# Patient Record
Sex: Female | Born: 1990 | Race: Black or African American | Hispanic: No | Marital: Single | Smoking: Former smoker
Health system: Southern US, Community
[De-identification: ages and names within clinical notes are randomized; demographics above are authoritative.]

## PROBLEM LIST (undated history)

## (undated) DIAGNOSIS — Z789 Other specified health status: Secondary | ICD-10-CM

## (undated) HISTORY — PX: MOUTH SURGERY: SHX715

## (undated) HISTORY — DX: Other specified health status: Z78.9

---

## 2011-12-20 DIAGNOSIS — IMO0002 Reserved for concepts with insufficient information to code with codable children: Secondary | ICD-10-CM

## 2015-01-27 NOTE — L&D Delivery Note (Signed)
Patient is 25 y.o. [id number] [redacted]w[redacted]d admitted for active, hx of History of prior pregnancy with IUGR newborn; Gestational thrombocytopenia ([place])    Delivery Note At 7:29 PM a viable female was delivered via Vaginal, Spontaneous Delivery (Presentation: LOA ).  APGAR: 8, 9; weight 8 lb 1.3 oz (3666 g).   Placenta status: intact, 3 vessel cord. Without complications  Anesthesia:  epidural Episiotomy: None Lacerations: None Est. Blood Loss (mL): 100  Mom to postpartum.  Baby to Couplet care / Skin to Skin.  Bufford Lope 09/14/2015, 9:11 PM      Upon arrival patient was complete and pushing. She pushed with good maternal effort to deliver a healthy baby boy. Baby delivered without difficulty, was noted to have good tone and place on maternal abdomen for oral suctioning, drying and stimulation. Delayed cord clamping performed. Placenta delivered intact with 3V cord. Vaginal canal and perineum was inspected and was intact. Pitocin was started and uterus massaged until bleeding slowed. Counts of sharps, instruments, and lap pads were all correct.   Bufford Lope, DO PGY-1, [facility] 8/19/20179:11 PM

## 2015-03-18 LAB — OB RESULTS CONSOLE RUBELLA ANTIBODY, IGM: Rubella: IMMUNE

## 2015-03-18 LAB — CYSTIC FIBROSIS DIAGNOSTIC STUDY: Interpretation-CFDNA:: NEGATIVE

## 2015-03-18 LAB — DRUG SCREEN, URINE

## 2015-03-18 LAB — OB RESULTS CONSOLE GC/CHLAMYDIA
CHLAMYDIA, DNA PROBE: NEGATIVE
GC PROBE AMP, GENITAL: NEGATIVE

## 2015-03-18 LAB — OB RESULTS CONSOLE HGB/HCT, BLOOD
HEMATOCRIT: 35 %
HEMOGLOBIN: 11.4 g/dL

## 2015-03-18 LAB — OB RESULTS CONSOLE RPR: RPR: NONREACTIVE

## 2015-03-18 LAB — OB RESULTS CONSOLE HEPATITIS B SURFACE ANTIGEN: Hepatitis B Surface Ag: NEGATIVE

## 2015-03-18 LAB — CULTURE, OB URINE
URINE CULTURE, OB: NEGATIVE
Urine Culture, OB: NEGATIVE

## 2015-03-18 LAB — OB RESULTS CONSOLE ANTIBODY SCREEN: ANTIBODY SCREEN: NEGATIVE

## 2015-03-18 LAB — OB RESULTS CONSOLE HIV ANTIBODY (ROUTINE TESTING): HIV: NONREACTIVE

## 2015-03-18 LAB — OB RESULTS CONSOLE PLATELET COUNT: PLATELETS: 164 10*3/uL

## 2015-03-18 LAB — SICKLE CELL SCREEN: Sickle Cell Screen: NORMAL

## 2015-03-18 LAB — OB RESULTS CONSOLE ABO/RH: RH TYPE: POSITIVE

## 2015-03-18 LAB — OB RESULTS CONSOLE VARICELLA ZOSTER ANTIBODY, IGG: Varicella: IMMUNE

## 2015-06-05 DIAGNOSIS — O0990 Supervision of high risk pregnancy, unspecified, unspecified trimester: Secondary | ICD-10-CM

## 2015-06-06 ENCOUNTER — Encounter: Payer: Self-pay | Admitting: Obstetrics & Gynecology

## 2015-07-08 ENCOUNTER — Encounter: Payer: Self-pay | Admitting: Obstetrics & Gynecology

## 2015-07-08 ENCOUNTER — Ambulatory Visit (INDEPENDENT_AMBULATORY_CARE_PROVIDER_SITE_OTHER): Payer: Medicaid Other | Admitting: Obstetrics & Gynecology

## 2015-07-08 VITALS — BP 116/74 | HR 78 | Wt 130.0 lb

## 2015-07-08 DIAGNOSIS — O99113 Other diseases of the blood and blood-forming organs and certain disorders involving the immune mechanism complicating pregnancy, third trimester: Secondary | ICD-10-CM | POA: Diagnosis present

## 2015-07-08 DIAGNOSIS — O0993 Supervision of high risk pregnancy, unspecified, third trimester: Secondary | ICD-10-CM

## 2015-07-08 DIAGNOSIS — Z23 Encounter for immunization: Secondary | ICD-10-CM

## 2015-07-08 DIAGNOSIS — Z8759 Personal history of other complications of pregnancy, childbirth and the puerperium: Secondary | ICD-10-CM | POA: Insufficient documentation

## 2015-07-08 DIAGNOSIS — Z8742 Personal history of other diseases of the female genital tract: Secondary | ICD-10-CM

## 2015-07-08 DIAGNOSIS — D696 Thrombocytopenia, unspecified: Secondary | ICD-10-CM

## 2015-07-08 LAB — POCT URINALYSIS DIP (DEVICE)
Bilirubin Urine: NEGATIVE
GLUCOSE, UA: NEGATIVE mg/dL
HGB URINE DIPSTICK: NEGATIVE
KETONES UR: NEGATIVE mg/dL
Nitrite: NEGATIVE
PROTEIN: NEGATIVE mg/dL
SPECIFIC GRAVITY, URINE: 1.02 (ref 1.005–1.030)
Urobilinogen, UA: 1 mg/dL (ref 0.0–1.0)
pH: 7 (ref 5.0–8.0)

## 2015-07-08 LAB — CBC
HCT: 31.6 % — ABNORMAL LOW (ref 35.0–45.0)
HEMOGLOBIN: 10.2 g/dL — AB (ref 11.7–15.5)
MCH: 30.3 pg (ref 27.0–33.0)
MCHC: 32.3 g/dL (ref 32.0–36.0)
MCV: 93.8 fL (ref 80.0–100.0)
MPV: 13.5 fL — ABNORMAL HIGH (ref 7.5–12.5)
PLATELETS: 128 10*3/uL — AB (ref 140–400)
RBC: 3.37 MIL/uL — AB (ref 3.80–5.10)
RDW: 13.5 % (ref 11.0–15.0)
WBC: 10 10*3/uL (ref 3.8–10.8)

## 2015-07-08 MED ORDER — TETANUS-DIPHTH-ACELL PERTUSSIS 5-2.5-18.5 LF-MCG/0.5 IM SUSP: 0.5000 mL | Freq: Once | INTRAMUSCULAR | Status: DC

## 2015-07-08 NOTE — Progress Notes (Signed)
Pt transferred from [facility].  28 week labs today.

## 2015-07-08 NOTE — Progress Notes (Signed)
Subjective:  Mallory Hampton is a 25 y.o. G2P0101 at [redacted]w[redacted]d being seen today for transfer of prenatal care from [facility] given history of IUGR and oligohydramnios in last pregnancy; she was induced for these indications and had a 3 lb 8 oz infant.  She is currently monitored for the following issues for this high-risk pregnancy and has Supervision of high-risk pregnancy and History of prior pregnancy with IUGR newborn on her problem list.  Patient reports no complaints.  Contractions: Not present. Vag. Bleeding: None.  Movement: Present. Denies leaking of fluid.   The following portions of the patient's history were reviewed and updated as appropriate: allergies, current medications, past family history, past medical history, past social history, past surgical history and problem list. Problem list updated.  Objective:   Filed Vitals:   07/08/15 0801  BP: 116/74  Pulse: 78  Weight: 130 lb (58.968 kg)    Fetal Status: Fetal Heart Rate (bpm): 143 Fundal Height: 32 cm Movement: Present     General:  Alert, oriented and cooperative. Patient is in no acute distress.  Skin: Skin is warm and dry. No rash noted.   Cardiovascular: Normal heart rate noted  Respiratory: Normal respiratory effort, no problems with respiration noted  Abdomen: Soft, gravid, appropriate for gestational age. Pain/Pressure: Absent     Pelvic: Cervical exam deferred        Extremities: Normal range of motion.  Edema: None  Mental Status: Normal mood and affect. Normal behavior. Normal judgment and thought content.   Urinalysis: Urine Protein: Negative Urine Glucose: Negative  Assessment and Plan:  Pregnancy: G2P0101 at [redacted]w[redacted]d  1. History of prior pregnancy with IUGR newborn Good fundal height for now, will continue to observe  2. Need for Tdap vaccination - Tdap (BOOSTRIX) injection 0.5 mL; Inject 0.5 mLs into the muscle once.  3. Supervision of high-risk pregnancy, third trimester Third trimester labs today - Glucose  Tolerance, 1 HR (50g) w/o Fasting - CBC - RPR - HIV antibody (with reflex) The nature of [place] with multiple MDs and other Advanced Practice Providers was explained to patient; also emphasized that residents, students are part of our team. Preterm labor symptoms and general obstetric precautions including but not limited to vaginal bleeding, contractions, leaking of fluid and fetal movement were reviewed in detail with the patient. Please refer to After Visit Summary for other counseling recommendations.  Return in about 2 weeks (around 07/22/2015) for OB Visit.   Osborne Oman, MD

## 2015-07-08 NOTE — Patient Instructions (Signed)
Return to clinic for any scheduled appointments or obstetric concerns, or go to [facility] for evaluation  Third Trimester of Pregnancy The third trimester is from week 29 through week 42, months 7 through 9. The third trimester is a time when the fetus is growing rapidly. At the end of the ninth month, the fetus is about 20 inches in length and weighs 6-10 pounds.  BODY CHANGES Your body goes through many changes during pregnancy. The changes vary from woman to woman.   Your weight will continue to increase. You can expect to gain 25-35 pounds (11-16 kg) by the end of the pregnancy.  You may begin to get stretch marks on your hips, abdomen, and breasts.  You may urinate more often because the fetus is moving lower into your pelvis and pressing on your bladder.  You may develop or continue to have heartburn as a result of your pregnancy.  You may develop constipation because certain hormones are causing the muscles that push waste through your intestines to slow down.  You may develop hemorrhoids or swollen, bulging veins (varicose veins).  You may have pelvic pain because of the weight gain and pregnancy hormones relaxing your joints between the bones in your pelvis. Backaches may result from overexertion of the muscles supporting your posture.  You may have changes in your hair. These can include thickening of your hair, rapid growth, and changes in texture. Some women also have hair loss during or after pregnancy, or hair that feels dry or thin. Your hair will most likely return to normal after your baby is born.  Your breasts will continue to grow and be tender. A yellow discharge may leak from your breasts called colostrum.  Your belly button may stick out.  You may feel short of breath because of your expanding uterus.  You may notice the fetus "dropping," or moving lower in your abdomen.  You may have a bloody mucus discharge. This usually occurs a few days to a week before labor  begins.  Your cervix becomes thin and soft (effaced) near your due date. WHAT TO EXPECT AT YOUR PRENATAL EXAMS  You will have prenatal exams every 2 weeks until week 36. Then, you will have weekly prenatal exams. During a routine prenatal visit:  You will be weighed to make sure you and the fetus are growing normally.  Your blood pressure is taken.  Your abdomen will be measured to track your baby's growth.  The fetal heartbeat will be listened to.  Any test results from the previous visit will be discussed.  You may have a cervical check near your due date to see if you have effaced. At around 36 weeks, your caregiver will check your cervix. At the same time, your caregiver will also perform a test on the secretions of the vaginal tissue. This test is to determine if a type of bacteria, Group B streptococcus, is present. Your caregiver will explain this further. Your caregiver may ask you:  What your birth plan is.  How you are feeling.  If you are feeling the baby move.  If you have had any abnormal symptoms, such as leaking fluid, bleeding, severe headaches, or abdominal cramping.  If you are using any tobacco products, including cigarettes, chewing tobacco, and electronic cigarettes.  If you have any questions. Other tests or screenings that may be performed during your third trimester include:  Blood tests that check for low iron levels (anemia).  Fetal testing to check the health, activity  level, and growth of the fetus. Testing is done if you have certain medical conditions or if there are problems during the pregnancy.  HIV (human immunodeficiency virus) testing. If you are at high risk, you may be screened for HIV during your third trimester of pregnancy. FALSE LABOR You may feel small, irregular contractions that eventually go away. These are called Braxton Hicks contractions, or false labor. Contractions may last for hours, days, or even weeks before true labor sets  in. If contractions come at regular intervals, intensify, or become painful, it is best to be seen by your caregiver.  SIGNS OF LABOR   Menstrual-like cramps.  Contractions that are 5 minutes apart or less.  Contractions that start on the top of the uterus and spread down to the lower abdomen and back.  A sense of increased pelvic pressure or back pain.  A watery or bloody mucus discharge that comes from the vagina. If you have any of these signs before the 37th week of pregnancy, call your caregiver right away. You need to go to the hospital to get checked immediately. HOME CARE INSTRUCTIONS   Avoid all smoking, herbs, alcohol, and unprescribed drugs. These chemicals affect the formation and growth of the baby.  Do not use any tobacco products, including cigarettes, chewing tobacco, and electronic cigarettes. If you need help quitting, ask your health care provider. You may receive counseling support and other resources to help you quit.  Follow your caregiver's instructions regarding medicine use. There are medicines that are either safe or unsafe to take during pregnancy.  Exercise only as directed by your caregiver. Experiencing uterine cramps is a good sign to stop exercising.  Continue to eat regular, healthy meals.  Wear a good support bra for breast tenderness.  Do not use hot tubs, steam rooms, or saunas.  Wear your seat belt at all times when driving.  Avoid raw meat, uncooked cheese, cat litter boxes, and soil used by cats. These carry germs that can cause birth defects in the baby.  Take your prenatal vitamins.  Take 1500-2000 mg of calcium daily starting at the 20th week of pregnancy until you deliver your baby.  Try taking a stool softener (if your caregiver approves) if you develop constipation. Eat more high-fiber foods, such as fresh vegetables or fruit and whole grains. Drink plenty of fluids to keep your urine clear or pale yellow.  Take warm sitz baths to  soothe any pain or discomfort caused by hemorrhoids. Use hemorrhoid cream if your caregiver approves.  If you develop varicose veins, wear support hose. Elevate your feet for 15 minutes, 3-4 times a day. Limit salt in your diet.  Avoid heavy lifting, wear low heal shoes, and practice good posture.  Rest a lot with your legs elevated if you have leg cramps or low back pain.  Visit your dentist if you have not gone during your pregnancy. Use a soft toothbrush to brush your teeth and be gentle when you floss.  A sexual relationship may be continued unless your caregiver directs you otherwise.  Do not travel far distances unless it is absolutely necessary and only with the approval of your caregiver.  Take prenatal classes to understand, practice, and ask questions about the labor and delivery.  Make a trial run to the hospital.  Pack your hospital bag.  Prepare the baby's nursery.  Continue to go to all your prenatal visits as directed by your caregiver. SEEK MEDICAL CARE IF:  You are unsure if  you are in labor or if your water has broken.  You have dizziness.  You have mild pelvic cramps, pelvic pressure, or nagging pain in your abdominal area.  You have persistent nausea, vomiting, or diarrhea.  You have a bad smelling vaginal discharge.  You have pain with urination. SEEK IMMEDIATE MEDICAL CARE IF:   You have a fever.  You are leaking fluid from your vagina.  You have spotting or bleeding from your vagina.  You have severe abdominal cramping or pain.  You have rapid weight loss or gain.  You have shortness of breath with chest pain.  You notice sudden or extreme swelling of your face, hands, ankles, feet, or legs.  You have not felt your baby move in over an hour.  You have severe headaches that do not go away with medicine.  You have vision changes.   This information is not intended to replace advice given to you by your health care provider. Make sure you  discuss any questions you have with your health care provider.   Document Released: 01/06/2001 Document Revised: 02/02/2014 Document Reviewed: 03/15/2012 Elsevier Interactive Patient Education [employer].

## 2015-07-09 LAB — RPR

## 2015-07-09 LAB — HIV ANTIBODY (ROUTINE TESTING W REFLEX): HIV 1&2 Ab, 4th Generation: NONREACTIVE

## 2015-07-09 LAB — GLUCOSE TOLERANCE, 1 HOUR (50G) W/O FASTING: GLUCOSE, 1 HR, GESTATIONAL: 94 mg/dL (ref <140)

## 2015-07-22 ENCOUNTER — Ambulatory Visit (INDEPENDENT_AMBULATORY_CARE_PROVIDER_SITE_OTHER): Payer: Medicaid Other | Admitting: Obstetrics and Gynecology

## 2015-07-22 VITALS — BP 114/65 | HR 76 | Wt 134.4 lb

## 2015-07-22 DIAGNOSIS — O99119 Other diseases of the blood and blood-forming organs and certain disorders involving the immune mechanism complicating pregnancy, unspecified trimester: Principal | ICD-10-CM

## 2015-07-22 DIAGNOSIS — O0990 Supervision of high risk pregnancy, unspecified, unspecified trimester: Secondary | ICD-10-CM

## 2015-07-22 DIAGNOSIS — D696 Thrombocytopenia, unspecified: Secondary | ICD-10-CM

## 2015-07-22 DIAGNOSIS — O99113 Other diseases of the blood and blood-forming organs and certain disorders involving the immune mechanism complicating pregnancy, third trimester: Secondary | ICD-10-CM | POA: Diagnosis not present

## 2015-07-22 DIAGNOSIS — Z23 Encounter for immunization: Secondary | ICD-10-CM

## 2015-07-22 LAB — POCT URINALYSIS DIP (DEVICE)
BILIRUBIN URINE: NEGATIVE
GLUCOSE, UA: NEGATIVE mg/dL
Hgb urine dipstick: NEGATIVE
KETONES UR: NEGATIVE mg/dL
Nitrite: NEGATIVE
PROTEIN: NEGATIVE mg/dL
SPECIFIC GRAVITY, URINE: 1.02 (ref 1.005–1.030)
Urobilinogen, UA: 0.2 mg/dL (ref 0.0–1.0)
pH: 7 (ref 5.0–8.0)

## 2015-07-22 NOTE — Progress Notes (Signed)
Subjective:  Mallory Hampton is a 25 y.o. G2P0101 at [redacted]w[redacted]d being seen today for ongoing prenatal care.  She is currently monitored for the following issues for this low-risk pregnancy and has Supervision of high-risk pregnancy and History of prior pregnancy with IUGR newborn on her problem list.  Patient reports no complaints.  Contractions: Not present.  .  Movement: Present. Denies leaking of fluid.   The following portions of the patient's history were reviewed and updated as appropriate: allergies, current medications, past family history, past medical history, past social history, past surgical history and problem list. Problem list updated.  Objective:   Filed Vitals:   07/22/15 0925  BP: 114/65  Pulse: 76  Weight: 134 lb 6.4 oz (60.963 kg)    Fetal Status: Fetal Heart Rate (bpm): 154   Movement: Present     General:  Alert, oriented and cooperative. Patient is in no acute distress.  Skin: Skin is warm and dry. No rash noted.   Cardiovascular: Normal heart rate noted  Respiratory: Normal respiratory effort, no problems with respiration noted  Abdomen: Soft, gravid, appropriate for gestational age. Pain/Pressure: Absent     Pelvic: Cervical exam deferred        Extremities: Normal range of motion.  Edema: None  Mental Status: Normal mood and affect. Normal behavior. Normal judgment and thought content.   Urinalysis:      Assessment and Plan:  Pregnancy: G2P0101 at [redacted]w[redacted]d  # Gestational thrombocytopenia - plts noted to be 128 2 wks ago, previously wnl - will monitor q 4-6 wks  # hx iugr - fundus measurement wnl, though has not increased much if any from 2 wks ago, will f/u at next visit  There are no diagnoses linked to this encounter. Preterm labor symptoms and general obstetric precautions including but not limited to vaginal bleeding, contractions, leaking of fluid and fetal movement were reviewed in detail with the patient. Please refer to After Visit Summary for other  counseling recommendations.  F/u 2 wks  Gwynne Edinger, MD

## 2015-07-22 NOTE — Addendum Note (Signed)
Addended by: Phillip Heal, DEMETRICE A on: 07/22/2015 09:57 AM   Modules accepted: Orders

## 2015-07-22 NOTE — Progress Notes (Signed)
Large leukocytes noted on urinalysis.  

## 2015-08-05 ENCOUNTER — Other Ambulatory Visit (HOSPITAL_COMMUNITY)
Admission: RE | Admit: 2015-08-05 | Discharge: 2015-08-05 | Disposition: A | Discharge status: Home / Self Care | Payer: Medicaid Other | Source: Ambulatory Visit | Attending: Obstetrics and Gynecology | Admitting: Obstetrics and Gynecology

## 2015-08-05 ENCOUNTER — Ambulatory Visit (INDEPENDENT_AMBULATORY_CARE_PROVIDER_SITE_OTHER): Payer: Medicaid Other | Admitting: Obstetrics and Gynecology

## 2015-08-05 VITALS — BP 111/69 | HR 66 | Wt 134.1 lb

## 2015-08-05 DIAGNOSIS — D696 Thrombocytopenia, unspecified: Secondary | ICD-10-CM

## 2015-08-05 DIAGNOSIS — Z8742 Personal history of other diseases of the female genital tract: Secondary | ICD-10-CM | POA: Diagnosis not present

## 2015-08-05 DIAGNOSIS — O99113 Other diseases of the blood and blood-forming organs and certain disorders involving the immune mechanism complicating pregnancy, third trimester: Secondary | ICD-10-CM

## 2015-08-05 DIAGNOSIS — Z113 Encounter for screening for infections with a predominantly sexual mode of transmission: Secondary | ICD-10-CM | POA: Diagnosis not present

## 2015-08-05 DIAGNOSIS — O0993 Supervision of high risk pregnancy, unspecified, third trimester: Secondary | ICD-10-CM

## 2015-08-05 DIAGNOSIS — D126 Benign neoplasm of colon, unspecified: Secondary | ICD-10-CM | POA: Diagnosis not present

## 2015-08-05 DIAGNOSIS — Z8759 Personal history of other complications of pregnancy, childbirth and the puerperium: Secondary | ICD-10-CM

## 2015-08-05 LAB — POCT URINALYSIS DIP (DEVICE)
Bilirubin Urine: NEGATIVE
GLUCOSE, UA: NEGATIVE mg/dL
HGB URINE DIPSTICK: NEGATIVE
Ketones, ur: NEGATIVE mg/dL
NITRITE: NEGATIVE
PROTEIN: 30 mg/dL — AB
SPECIFIC GRAVITY, URINE: 1.02 (ref 1.005–1.030)
UROBILINOGEN UA: 1 mg/dL (ref 0.0–1.0)
pH: 8.5 — ABNORMAL HIGH (ref 5.0–8.0)

## 2015-08-05 LAB — OB RESULTS CONSOLE GBS: GBS: POSITIVE

## 2015-08-05 LAB — CBC
HCT: 31.5 % — ABNORMAL LOW (ref 35.0–45.0)
Hemoglobin: 10.6 g/dL — ABNORMAL LOW (ref 11.7–15.5)
MCH: 31.1 pg (ref 27.0–33.0)
MCHC: 33.7 g/dL (ref 32.0–36.0)
MCV: 92.4 fL (ref 80.0–100.0)
MPV: 13.1 fL — ABNORMAL HIGH (ref 7.5–12.5)
PLATELETS: 121 10*3/uL — AB (ref 140–400)
RBC: 3.41 MIL/uL — ABNORMAL LOW (ref 3.80–5.10)
RDW: 13.5 % (ref 11.0–15.0)
WBC: 10.5 10*3/uL (ref 3.8–10.8)

## 2015-08-05 NOTE — Progress Notes (Signed)
Subjective:  Mallory Hampton is a 25 y.o. G2P0101 at [redacted]w[redacted]d being seen today for ongoing prenatal care.  She is currently monitored for the following issues for this low-risk pregnancy and has Supervision of high-risk pregnancy; History of prior pregnancy with IUGR newborn; and Gestational thrombocytopenia ([place]) on her problem list.  Patient reports no complaints.   Contractions: Not present.  .  Movement: Present. Denies leaking of fluid.   The following portions of the patient's history were reviewed and updated as appropriate: allergies, current medications, past family history, past medical history, past social history, past surgical history and problem list. Problem list updated.  Objective:   Filed Vitals:   08/05/15 1056  BP: 111/69  Pulse: 66  Weight: 134 lb 1.6 oz (60.827 kg)    Fetal Status: Fetal Heart Rate (bpm): 144   Movement: Present     General:  Alert, oriented and cooperative. Patient is in no acute distress.  Skin: Skin is warm and dry. No rash noted.   Cardiovascular: Normal heart rate noted  Respiratory: Normal respiratory effort, no problems with respiration noted  Abdomen: Soft, gravid, appropriate for gestational age. Pain/Pressure: Absent     Pelvic:  Cervical exam deferred        Extremities: Normal range of motion.  Edema: None  Mental Status: Normal mood and affect. Normal behavior. Normal judgment and thought content.   Urinalysis: Urine Protein: 1+ Urine Glucose: Negative  Assessment and Plan:  Pregnancy: G2P0101 at [redacted]w[redacted]d  1. Supervision of high risk pregnancy, antepartum, third trimester Routine care - Culture, beta strep (group b only) - GC/Chlamydia probe amp ([place])not at [facility]   2. H/o FGR, oligo Normal FH but given history, u/s for sometime in 7d ordered  3. Gest thrombocytopenia 72m surveillance cbc today CBC Latest Ref Rng 07/08/2015 03/18/2015  WBC 3.8 - 10.8 K/uL 10.0 -  Hemoglobin 11.7 - 15.5 g/dL 10.2(L) 11.4  Hematocrit 35.0 -  45.0 % 31.6(L) 35  Platelets 140 - 400 K/uL 128(L) 164    Preterm labor symptoms and general obstetric precautions including but not limited to vaginal bleeding, contractions, leaking of fluid and fetal movement were reviewed in detail with the patient. Please refer to After Visit Summary for other counseling recommendations.   Return in about 1 week (around 08/12/2015).   Aletha Halim, MD

## 2015-08-06 ENCOUNTER — Ambulatory Visit (HOSPITAL_COMMUNITY)
Admission: RE | Admit: 2015-08-06 | Discharge: 2015-08-06 | Disposition: A | Discharge status: Home / Self Care | Payer: Medicaid Other | Source: Ambulatory Visit | Attending: Obstetrics and Gynecology | Admitting: Obstetrics and Gynecology

## 2015-08-06 ENCOUNTER — Telehealth: Payer: Self-pay

## 2015-08-06 ENCOUNTER — Encounter: Payer: Self-pay | Admitting: Obstetrics and Gynecology

## 2015-08-06 ENCOUNTER — Encounter (HOSPITAL_COMMUNITY): Payer: Self-pay

## 2015-08-06 VITALS — BP 114/72 | HR 82 | Wt 135.2 lb

## 2015-08-06 DIAGNOSIS — O09293 Supervision of pregnancy with other poor reproductive or obstetric history, third trimester: Secondary | ICD-10-CM | POA: Insufficient documentation

## 2015-08-06 DIAGNOSIS — D696 Thrombocytopenia, unspecified: Secondary | ICD-10-CM | POA: Diagnosis not present

## 2015-08-06 DIAGNOSIS — O0993 Supervision of high risk pregnancy, unspecified, third trimester: Secondary | ICD-10-CM

## 2015-08-06 DIAGNOSIS — O99113 Other diseases of the blood and blood-forming organs and certain disorders involving the immune mechanism complicating pregnancy, third trimester: Principal | ICD-10-CM

## 2015-08-06 DIAGNOSIS — Z8759 Personal history of other complications of pregnancy, childbirth and the puerperium: Secondary | ICD-10-CM

## 2015-08-06 DIAGNOSIS — Z3A35 35 weeks gestation of pregnancy: Secondary | ICD-10-CM | POA: Insufficient documentation

## 2015-08-06 DIAGNOSIS — O99820 Streptococcus B carrier state complicating pregnancy: Secondary | ICD-10-CM | POA: Insufficient documentation

## 2015-08-06 DIAGNOSIS — Z36 Encounter for antenatal screening of mother: Secondary | ICD-10-CM | POA: Insufficient documentation

## 2015-08-06 LAB — CULTURE, BETA STREP (GROUP B ONLY)

## 2015-08-06 LAB — GC/CHLAMYDIA PROBE AMP (~~LOC~~) NOT AT ARMC
CHLAMYDIA, DNA PROBE: NEGATIVE
Neisseria Gonorrhea: NEGATIVE

## 2015-08-06 NOTE — Consult Note (Signed)
MFM consult  26 yr old G84P0101 at [redacted]w[redacted]d with history of baby with growth restriction and possible abruption and now with likely gestational thrombocytopenia referred by Dr. Ilda Basset for fetal ultrasound and consult.  Past OB hx: induction at 36w for fetal growth restriction and bleeding; SVD PMH: none PSH: none  Ultrasound today shows: single intrauterine pregnancy. Estimated fetal weight is in the 53rd%. Posterior placenta without evidence of previa. Normal amniotic fluid index. The views of the ductal and aortic arches and abdominal cord insertion are limited. The remainder of the limited anatomy survey is normal.  I counseled the patient as follows: 1. Appropriate fetal growth. 2. Normal limited anatomy survey: - patient understands the limitations of ultrasound in detecting fetal anomalies - limited by advanced gestational age 85. History of baby with growth restriction/ possible abruption: - recommend fetal growth every 4 weeks; appropriate fetal growth today - recommend in subsequent pregnancies starting a low dose aspirin by 19 weeks to reduce the risk of fetal growth restriction 4. Borderline thrombocytopenia: - discussed this is likely due to gestational thrombocytopenia given borderline platelets of 121,000 presenting late in pregnancy - discussed gestational thrombocytopenia is a benign, self-limited condition that requires no additional evaluation or treatment. Gestational thrombocytopenia accounts for the vast majority of all thrombocytopenias discovered during pregnancy, and almost all cases of thrombocytopenia in women with uncomplicated pregnancies.  Gestational thrombocytopenia is typically is characterized by the following:  ?Onset may be early in pregnancy, most common at delivery ?Mild thrombocytopenia (?80,000/microL, typically 100,000 to 150,000/microL) ?No increased bleeding or bruising ?No associated abnormalities on CBC ?No fetal or neonatal  thrombocytopenia Gestational thrombocytopenia is a diagnosis of exclusion. The diagnosis of gestational thrombocytoenpia is accepted if the woman has mild thrombocytopenia (platelet count 100,000 to 150,000/microL), especially during late pregnancy and at delivery, with no other associated findings on complete blood count (CBC) or physical examination. It usually resolves postpartum, but in some women the return to a normal platelet count requires more than six weeks.  Gestational thrombocytopenia requires no treatment and no change of normal prenatal care and management of delivery. No diagnostic testing is necessary because a platelet count >100,000/microL causes no risk for the mother.  - recommend repeat platelet level in 3 weeks - discussed if platelets drop to <100,000 regional anesthesia may not be an option - if platelets drop <100,000 recommend consult with Anesthesia- if epidural desired and not able to be done based on platelet counts can consider a course of prednisone prior to delivery to increase platelet levels  I spent a total of 25 minutes with the patient of which >50% was in face to face consultation.  [place], MD

## 2015-08-12 ENCOUNTER — Ambulatory Visit (INDEPENDENT_AMBULATORY_CARE_PROVIDER_SITE_OTHER): Payer: Medicaid Other | Admitting: Family Medicine

## 2015-08-12 VITALS — BP 105/72 | HR 66 | Wt 136.0 lb

## 2015-08-12 DIAGNOSIS — O0993 Supervision of high risk pregnancy, unspecified, third trimester: Secondary | ICD-10-CM | POA: Diagnosis not present

## 2015-08-12 LAB — POCT URINALYSIS DIP (DEVICE)
BILIRUBIN URINE: NEGATIVE
GLUCOSE, UA: NEGATIVE mg/dL
Hgb urine dipstick: NEGATIVE
Ketones, ur: NEGATIVE mg/dL
NITRITE: NEGATIVE
Protein, ur: NEGATIVE mg/dL
Specific Gravity, Urine: 1.02 (ref 1.005–1.030)
UROBILINOGEN UA: 1 mg/dL (ref 0.0–1.0)
pH: 7 (ref 5.0–8.0)

## 2015-08-12 NOTE — Progress Notes (Signed)
Breastfeeding discussed with patient

## 2015-08-12 NOTE — Progress Notes (Signed)
Subjective:  Mallory Hampton is a 25 y.o. G2P0101 at [redacted]w[redacted]d being seen today for ongoing prenatal care.  She is currently monitored for the following issues for this high-risk pregnancy and has Supervision of high-risk pregnancy; History of prior pregnancy with IUGR newborn; Gestational thrombocytopenia ([place]); and GBS (group B Streptococcus carrier), +RV culture, currently pregnant on her problem list.  Patient reports no complaints.  Contractions: Not present. Vag. Bleeding: None.  Movement: Present. Denies leaking of fluid.   The following portions of the patient's history were reviewed and updated as appropriate: allergies, current medications, past family history, past medical history, past social history, past surgical history and problem list. Problem list updated.  Objective:   Filed Vitals:   08/12/15 1105  BP: 105/72  Pulse: 66  Weight: 136 lb (61.689 kg)    Fetal Status: Fetal Heart Rate (bpm): 142 lb   Movement: Present     General:  Alert, oriented and cooperative. Patient is in no acute distress.  Skin: Skin is warm and dry. No rash noted.   Cardiovascular: Normal heart rate noted  Respiratory: Normal respiratory effort, no problems with respiration noted  Abdomen: Soft, gravid, appropriate for gestational age. Pain/Pressure: Absent     Pelvic:  Cervical exam deferred        Extremities: Normal range of motion.  Edema: None  Mental Status: Normal mood and affect. Normal behavior. Normal judgment and thought content.   Urinalysis: Urine Protein: Negative Urine Glucose: Negative  Assessment and Plan:  Pregnancy: G2P0101 at [redacted]w[redacted]d  1. Supervision of high-risk pregnancy, third trimester Continue current care. History of IUGR. Pt w/ last US showing EFW 53%. Repeat already scheduled.  Check platelets prior to delivery for history of thrombocytopenia.   Preterm labor symptoms and general obstetric precautions including but not limited to vaginal bleeding, contractions, leaking  of fluid and fetal movement were reviewed in detail with the patient. Please refer to After Visit Summary for other counseling recommendations.  Return in about 1 week (around 08/19/2015) for HROB.   Waldemar Dickens, MD

## 2015-08-12 NOTE — Patient Instructions (Signed)
Group B streptococcus (GBS) is a type of bacteria often found in healthy women. GBS is not the same as the bacteria that causes strep throat. You may have GBS in your vagina, rectum, or bladder. GBS does not spread through sexual contact, but it can be passed to a baby during childbirth. This can be dangerous for your baby. It is not dangerous to you and usually does not cause any symptoms. Your health care provider may test you for GBS when your pregnancy is between 35 and 37 weeks. GBS is dangerous only during birth, so there is no need to test for it earlier. It is possible to have GBS during pregnancy and never pass it to your baby. If your test results are positive for GBS, your health care provider may recommend giving you antibiotic medicine during delivery to make sure your baby stays healthy. RISK FACTORS You are more likely to pass GBS to your baby if:   Your water breaks (ruptured membrane) or you go into labor before 37 weeks.  Your water breaks 18 hours before you deliver.  You passed GBS during a previous pregnancy.  You have a urinary tract infection caused by GBS any time during pregnancy.  You have a fever during labor. SYMPTOMS Most women who have GBS do not have any symptoms. If you have a urinary tract infection caused by GBS, you might have frequent or painful urination and fever. Babies who get GBS usually show symptoms within 7 days of birth. Symptoms may include:   Breathing problems.  Heart and blood pressure problems.  Digestive and kidney problems. DIAGNOSIS Routine screening for GBS is recommended for all pregnant women. A health care provider takes a sample of the fluid in your vagina and rectum with a swab. It is then sent to a lab to be checked for GBS. A sample of your urine may also be checked for the bacteria.  TREATMENT If you test positive for GBS, you may need treatment with an antibiotic medicine during labor. As soon as you go into labor, or as soon as  your membranes rupture, you will get the antibiotic medicine through an IV access. You will continue to get the medicine until after you give birth. You do not need antibiotic medicine if you are having a cesarean delivery.If your baby shows signs or symptoms of GBS after birth, your baby can also be treated with an antibiotic medicine. HOME CARE INSTRUCTIONS   Take all antibiotic medicine as prescribed by your health care provider. Only take medicine as directed.   Continue with prenatal visits and care.   Keep all follow-up appointments.  SEEK MEDICAL CARE IF:   You have pain when you urinate.   You have to urinate frequently.   You have a fever.  SEEK IMMEDIATE MEDICAL CARE IF:   Your membranes rupture.  You go into labor.   This information is not intended to replace advice given to you by your health care provider. Make sure you discuss any questions you have with your health care provider.   Document Released: 04/21/2007 Document Revised: 01/17/2013 Document Reviewed: 11/04/2012 Elsevier Interactive Patient Education [employer]. Breastfeeding Deciding to breastfeed is one of the best choices you can make for you and your baby. A change in hormones during pregnancy causes your breast tissue to grow and increases the number and size of your milk ducts. These hormones also allow proteins, sugars, and fats from your blood supply to make breast milk in your  milk-producing glands. Hormones prevent breast milk from being released before your baby is born as well as prompt milk flow after birth. Once breastfeeding has begun, thoughts of your baby, as well as his or her sucking or crying, can stimulate the release of milk from your milk-producing glands.  BENEFITS OF BREASTFEEDING For Your Baby  Your first milk (colostrum) helps your baby's digestive system function better.  There are antibodies in your milk that help your baby fight off infections.  Your baby has a  lower incidence of asthma, allergies, and sudden infant death syndrome.  The nutrients in breast milk are better for your baby than infant formulas and are designed uniquely for your baby's needs.  Breast milk improves your baby's brain development.  Your baby is less likely to develop other conditions, such as childhood obesity, asthma, or type 2 diabetes mellitus. For You  Breastfeeding helps to create a very special bond between you and your baby.  Breastfeeding is convenient. Breast milk is always available at the correct temperature and costs nothing.  Breastfeeding helps to burn calories and helps you lose the weight gained during pregnancy.  Breastfeeding makes your uterus contract to its prepregnancy size faster and slows bleeding (lochia) after you give birth.   Breastfeeding helps to lower your risk of developing type 2 diabetes mellitus, osteoporosis, and breast or ovarian cancer later in life. SIGNS THAT YOUR BABY IS HUNGRY Early Signs of Hunger  Increased alertness or activity.  Stretching.  Movement of the head from side to side.  Movement of the head and opening of the mouth when the corner of the mouth or cheek is stroked (rooting).  Increased sucking sounds, smacking lips, cooing, sighing, or squeaking.  Hand-to-mouth movements.  Increased sucking of fingers or hands. Late Signs of Hunger  Fussing.  Intermittent crying. Extreme Signs of Hunger Signs of extreme hunger will require calming and consoling before your baby will be able to breastfeed successfully. Do not wait for the following signs of extreme hunger to occur before you initiate breastfeeding:  Restlessness.  A loud, strong cry.  Screaming. BREASTFEEDING BASICS Breastfeeding Initiation  Find a comfortable place to sit or lie down, with your neck and back well supported.  Place a pillow or rolled up blanket under your baby to bring him or her to the level of your breast (if you are  seated). Nursing pillows are specially designed to help support your arms and your baby while you breastfeed.  Make sure that your baby's abdomen is facing your abdomen.  Gently massage your breast. With your fingertips, massage from your chest wall toward your nipple in a circular motion. This encourages milk flow. You may need to continue this action during the feeding if your milk flows slowly.  Support your breast with 4 fingers underneath and your thumb above your nipple. Make sure your fingers are well away from your nipple and your baby's mouth.  Stroke your baby's lips gently with your finger or nipple.  When your baby's mouth is open wide enough, quickly bring your baby to your breast, placing your entire nipple and as much of the colored area around your nipple (areola) as possible into your baby's mouth.  More areola should be visible above your baby's upper lip than below the lower lip.  Your baby's tongue should be between his or her lower gum and your breast.  Ensure that your baby's mouth is correctly positioned around your nipple (latched). Your baby's lips should create a  seal on your breast and be turned out (everted).  It is common for your baby to suck about 2-3 minutes in order to start the flow of breast milk. Latching Teaching your baby how to latch on to your breast properly is very important. An improper latch can cause nipple pain and decreased milk supply for you and poor weight gain in your baby. Also, if your baby is not latched onto your nipple properly, he or she may swallow some air during feeding. This can make your baby fussy. Burping your baby when you switch breasts during the feeding can help to get rid of the air. However, teaching your baby to latch on properly is still the best way to prevent fussiness from swallowing air while breastfeeding. Signs that your baby has successfully latched on to your nipple:  Silent tugging or silent sucking, without  causing you pain.  Swallowing heard between every 3-4 sucks.  Muscle movement above and in front of his or her ears while sucking. Signs that your baby has not successfully latched on to nipple:  Sucking sounds or smacking sounds from your baby while breastfeeding.  Nipple pain. If you think your baby has not latched on correctly, slip your finger into the corner of your baby's mouth to break the suction and place it between your baby's gums. Attempt breastfeeding initiation again. Signs of Successful Breastfeeding Signs from your baby:  A gradual decrease in the number of sucks or complete cessation of sucking.  Falling asleep.  Relaxation of his or her body.  Retention of a small amount of milk in his or her mouth.  Letting go of your breast by himself or herself. Signs from you:  Breasts that have increased in firmness, weight, and size 1-3 hours after feeding.  Breasts that are softer immediately after breastfeeding.  Increased milk volume, as well as a change in milk consistency and color by the fifth day of breastfeeding.  Nipples that are not sore, cracked, or bleeding. Signs That Your [employer] is Getting Enough Milk  Wetting at least 3 diapers in a 24-hour period. The urine should be clear and pale yellow by age 67 days.  At least 3 stools in a 24-hour period by age 67 days. The stool should be soft and yellow.  At least 3 stools in a 24-hour period by age 520 days. The stool should be seedy and yellow.  No loss of weight greater than 10% of birth weight during the first 76 days of age.  Average weight gain of 4-7 ounces (113-198 g) per week after age 52 days.  Consistent daily weight gain by age 62 days, without weight loss after the age of 2 weeks. After a feeding, your baby may spit up a small amount. This is common. BREASTFEEDING FREQUENCY AND DURATION Frequent feeding will help you make more milk and can prevent sore nipples and breast engorgement. Breastfeed when you  feel the need to reduce the fullness of your breasts or when your baby shows signs of hunger. This is called "breastfeeding on demand." Avoid introducing a pacifier to your baby while you are working to establish breastfeeding (the first 4-6 weeks after your baby is born). After this time you may choose to use a pacifier. Research has shown that pacifier use during the first year of a baby's life decreases the risk of sudden infant death syndrome (SIDS). Allow your baby to feed on each breast as long as he or she wants. Breastfeed until your baby  is finished feeding. When your baby unlatches or falls asleep while feeding from the first breast, offer the second breast. Because newborns are often sleepy in the first few weeks of life, you may need to awaken your baby to get him or her to feed. Breastfeeding times will vary from baby to baby. However, the following rules can serve as a guide to help you ensure that your baby is properly fed:  Newborns (babies 88 weeks of age or younger) may breastfeed every 1-3 hours.  Newborns should not go longer than 3 hours during the day or 5 hours during the night without breastfeeding.  You should breastfeed your baby a minimum of 8 times in a 24-hour period until you begin to introduce solid foods to your baby at around 18 months of age. BREAST MILK PUMPING Pumping and storing breast milk allows you to ensure that your baby is exclusively fed your breast milk, even at times when you are unable to breastfeed. This is especially important if you are going back to work while you are still breastfeeding or when you are not able to be present during feedings. Your lactation consultant can give you guidelines on how long it is safe to store breast milk. A breast pump is a machine that allows you to pump milk from your breast into a sterile bottle. The pumped breast milk can then be stored in a refrigerator or freezer. Some breast pumps are operated by hand, while others use  electricity. Ask your lactation consultant which type will work best for you. Breast pumps can be purchased, but some hospitals and breastfeeding support groups lease breast pumps on a monthly basis. A lactation consultant can teach you how to hand express breast milk, if you prefer not to use a pump. CARING FOR YOUR BREASTS WHILE YOU BREASTFEED Nipples can become dry, cracked, and sore while breastfeeding. The following recommendations can help keep your breasts moisturized and healthy:  Avoid using soap on your nipples.  Wear a supportive bra. Although not required, special nursing bras and tank tops are designed to allow access to your breasts for breastfeeding without taking off your entire bra or top. Avoid wearing underwire-style bras or extremely tight bras.  Air dry your nipples for 3-6minutes after each feeding.  Use only cotton bra pads to absorb leaked breast milk. Leaking of breast milk between feedings is normal.  Use lanolin on your nipples after breastfeeding. Lanolin helps to maintain your skin's normal moisture barrier. If you use pure lanolin, you do not need to wash it off before feeding your baby again. Pure lanolin is not toxic to your baby. You may also hand express a few drops of breast milk and gently massage that milk into your nipples and allow the milk to air dry. In the first few weeks after giving birth, some women experience extremely full breasts (engorgement). Engorgement can make your breasts feel heavy, warm, and tender to the touch. Engorgement peaks within 3-5 days after you give birth. The following recommendations can help ease engorgement:  Completely empty your breasts while breastfeeding or pumping. You may want to start by applying warm, moist heat (in the shower or with warm water-soaked hand towels) just before feeding or pumping. This increases circulation and helps the milk flow. If your baby does not completely empty your breasts while breastfeeding,  pump any extra milk after he or she is finished.  Wear a snug bra (nursing or regular) or tank top for 1-2 days to  signal your body to slightly decrease milk production.  Apply ice packs to your breasts, unless this is too uncomfortable for you.  Make sure that your baby is latched on and positioned properly while breastfeeding. If engorgement persists after 48 hours of following these recommendations, contact your health care provider or a Science writer. OVERALL HEALTH CARE RECOMMENDATIONS WHILE BREASTFEEDING  Eat healthy foods. Alternate between meals and snacks, eating 3 of each per day. Because what you eat affects your breast milk, some of the foods may make your baby more irritable than usual. Avoid eating these foods if you are sure that they are negatively affecting your baby.  Drink milk, fruit juice, and water to satisfy your thirst (about 10 glasses a day).  Rest often, relax, and continue to take your prenatal vitamins to prevent fatigue, stress, and anemia.  Continue breast self-awareness checks.  Avoid chewing and smoking tobacco. Chemicals from cigarettes that pass into breast milk and exposure to secondhand smoke may harm your baby.  Avoid alcohol and drug use, including marijuana. Some medicines that may be harmful to your baby can pass through breast milk. It is important to ask your health care provider before taking any medicine, including all over-the-counter and prescription medicine as well as vitamin and herbal supplements. It is possible to become pregnant while breastfeeding. If birth control is desired, ask your health care provider about options that will be safe for your baby. SEEK MEDICAL CARE IF:  You feel like you want to stop breastfeeding or have become frustrated with breastfeeding.  You have painful breasts or nipples.  Your nipples are cracked or bleeding.  Your breasts are red, tender, or warm.  You have a swollen area on either  breast.  You have a fever or chills.  You have nausea or vomiting.  You have drainage other than breast milk from your nipples.  Your breasts do not become full before feedings by the fifth day after you give birth.  You feel sad and depressed.  Your baby is too sleepy to eat well.  Your baby is having trouble sleeping.   Your baby is wetting less than 3 diapers in a 24-hour period.  Your baby has less than 3 stools in a 24-hour period.  Your baby's skin or the white part of his or her eyes becomes yellow.   Your baby is not gaining weight by 69 days of age. SEEK IMMEDIATE MEDICAL CARE IF:  Your baby is overly tired (lethargic) and does not want to wake up and feed.  Your baby develops an unexplained fever.   This information is not intended to replace advice given to you by your health care provider. Make sure you discuss any questions you have with your health care provider.   Document Released: 01/12/2005 Document Revised: 10/03/2014 Document Reviewed: 07/06/2012 Elsevier Interactive Patient Education [employer].

## 2015-08-19 ENCOUNTER — Encounter: Payer: Medicaid Other | Admitting: Obstetrics & Gynecology

## 2015-09-02 ENCOUNTER — Ambulatory Visit (HOSPITAL_COMMUNITY)
Admission: RE | Admit: 2015-09-02 | Discharge: 2015-09-02 | Disposition: A | Discharge status: Home / Self Care | Payer: Medicaid Other | Source: Ambulatory Visit | Attending: Obstetrics and Gynecology | Admitting: Obstetrics and Gynecology

## 2015-09-02 ENCOUNTER — Encounter (HOSPITAL_COMMUNITY): Payer: Self-pay

## 2015-09-02 ENCOUNTER — Other Ambulatory Visit (HOSPITAL_COMMUNITY): Payer: Self-pay | Admitting: Maternal and Fetal Medicine

## 2015-09-02 DIAGNOSIS — Z8759 Personal history of other complications of pregnancy, childbirth and the puerperium: Secondary | ICD-10-CM

## 2015-09-02 DIAGNOSIS — Z36 Encounter for antenatal screening of mother: Secondary | ICD-10-CM | POA: Insufficient documentation

## 2015-09-02 DIAGNOSIS — Z3A39 39 weeks gestation of pregnancy: Secondary | ICD-10-CM

## 2015-09-02 DIAGNOSIS — O99113 Other diseases of the blood and blood-forming organs and certain disorders involving the immune mechanism complicating pregnancy, third trimester: Secondary | ICD-10-CM | POA: Insufficient documentation

## 2015-09-02 DIAGNOSIS — O09293 Supervision of pregnancy with other poor reproductive or obstetric history, third trimester: Secondary | ICD-10-CM | POA: Insufficient documentation

## 2015-09-05 ENCOUNTER — Encounter: Payer: Self-pay | Admitting: Obstetrics & Gynecology

## 2015-09-05 ENCOUNTER — Ambulatory Visit (INDEPENDENT_AMBULATORY_CARE_PROVIDER_SITE_OTHER): Payer: Medicaid Other | Admitting: Obstetrics & Gynecology

## 2015-09-05 VITALS — BP 119/64 | HR 73 | Wt 144.0 lb

## 2015-09-05 DIAGNOSIS — O0993 Supervision of high risk pregnancy, unspecified, third trimester: Secondary | ICD-10-CM

## 2015-09-05 DIAGNOSIS — D696 Thrombocytopenia, unspecified: Secondary | ICD-10-CM | POA: Diagnosis not present

## 2015-09-05 DIAGNOSIS — O99113 Other diseases of the blood and blood-forming organs and certain disorders involving the immune mechanism complicating pregnancy, third trimester: Secondary | ICD-10-CM | POA: Diagnosis present

## 2015-09-05 LAB — CBC
HEMATOCRIT: 31.1 % — AB (ref 35.0–45.0)
Hemoglobin: 10.2 g/dL — ABNORMAL LOW (ref 11.7–15.5)
MCH: 30.2 pg (ref 27.0–33.0)
MCHC: 32.8 g/dL (ref 32.0–36.0)
MCV: 92 fL (ref 80.0–100.0)
MPV: 13.9 fL — ABNORMAL HIGH (ref 7.5–12.5)
PLATELETS: 110 10*3/uL — AB (ref 140–400)
RBC: 3.38 MIL/uL — ABNORMAL LOW (ref 3.80–5.10)
RDW: 13.5 % (ref 11.0–15.0)
WBC: 10 10*3/uL (ref 3.8–10.8)

## 2015-09-05 LAB — POCT URINALYSIS DIP (DEVICE)
Bilirubin Urine: NEGATIVE
Glucose, UA: NEGATIVE mg/dL
Hgb urine dipstick: NEGATIVE
KETONES UR: NEGATIVE mg/dL
Nitrite: NEGATIVE
PH: 7 (ref 5.0–8.0)
PROTEIN: 30 mg/dL — AB
SPECIFIC GRAVITY, URINE: 1.025 (ref 1.005–1.030)
UROBILINOGEN UA: 2 mg/dL — AB (ref 0.0–1.0)

## 2015-09-05 NOTE — Patient Instructions (Signed)
Vaginal Delivery °During delivery, your health care provider will help you give birth to your baby. During a vaginal delivery, you will work to push the baby out of your vagina. However, before you can push your baby out, a few things need to happen. The opening of your uterus (cervix) has to soften, thin out, and open up (dilate) all the way to 10 cm. Also, your baby has to move down from the uterus into your vagina.  °SIGNS OF LABOR  °Your health care provider will first need to make sure you are in labor. Signs of labor include:  °· Passing what is called the mucous plug before labor begins. This is a small amount of blood-stained mucus. °· Having regular, painful uterine contractions.   °· The time between contractions gets shorter.   °· The discomfort and pain gradually get more intense. °· Contraction pains get worse when walking and do not go away when resting.   °· Your cervix becomes thinner (effacement) and dilates. °BEFORE THE DELIVERY °Once you are in labor and admitted into the hospital or care center, your health care provider may do the following:  °· Perform a complete physical exam. °· Review any complications related to pregnancy or labor.  °· Check your blood pressure, pulse, temperature, and heart rate (vital signs).   °· Determine if, and when, the rupture of amniotic membranes occurred. °· Do a vaginal exam (using a sterile glove and lubricant) to determine:   °¨ The position (presentation) of the baby. Is the baby's head presenting first (vertex) in the birth canal (vagina), or are the feet or buttocks first (breech)?   °¨ The level (station) of the baby's head within the birth canal.   °¨ The effacement and dilatation of the cervix.   °· An electronic fetal monitor is usually placed on your abdomen when you first arrive. This is used to monitor your contractions and the baby's heart rate. °¨ When the monitor is on your abdomen (external fetal monitor), it can only pick up the frequency and  length of your contractions. It cannot tell the strength of your contractions. °¨ If it becomes necessary for your health care provider to know exactly how strong your contractions are or to see exactly what the baby's heart rate is doing, an internal monitor may be inserted into your vagina and uterus. Your health care provider will discuss the benefits and risks of using an internal monitor and obtain your permission before inserting the device. °¨ Continuous fetal monitoring may be needed if you have an epidural, are receiving certain medicines (such as oxytocin), or have pregnancy or labor complications. °· An IV access tube may be placed into a vein in your arm to deliver fluids and medicines if necessary. °THREE STAGES OF LABOR AND DELIVERY °Normal labor and delivery is divided into three stages. °First Stage °This stage starts when you begin to contract regularly and your cervix begins to efface and dilate. It ends when your cervix is completely open (fully dilated). The first stage is the longest stage of labor and can last from 3 hours to 15 hours.  °Several methods are available to help with labor pain. You and your health care provider will decide which option is best for you. Options include:  °· Opioid medicines. These are strong pain medicines that you can get through your IV tube or as a shot into your muscle. These medicines lessen pain but do not make it go away completely.  °· Epidural. A medicine is given through a thin tube that   is inserted in your back. The medicine numbs the lower part of your body and prevents any pain in that area. °· Paracervical pain medicine. This is an injection of an anesthetic on each side of your cervix.   °· You may request natural childbirth, which does not involve the use of pain medicines or an epidural during labor and delivery. Instead, you will use other things, such as breathing exercises, to help cope with the pain. °Second Stage °The second stage of labor  begins when your cervix is fully dilated at 10 cm. It continues until you push your baby down through the birth canal and the baby is born. This stage can take only minutes or several hours. °· The location of your baby's head as it moves through the birth canal is reported as a number called a station. If the baby's head has not started its descent, the station is described as being at minus 3 (-3). When your baby's head is at the zero station, it is at the middle of the birth canal and is engaged in the pelvis. The station of your baby helps indicate the progress of the second stage of labor. °· When your baby is born, your health care provider may hold the baby with his or her head lowered to prevent amniotic fluid, mucus, and blood from getting into the baby's lungs. The baby's mouth and nose may be suctioned with a small bulb syringe to remove any additional fluid. °· Your health care provider may then place the baby on your stomach. It is important to keep the baby from getting cold. To do this, the health care provider will dry the baby off, place the baby directly on your skin (with no blankets between you and the baby), and cover the baby with warm, dry blankets.   °· The umbilical cord is cut. °Third Stage °During the third stage of labor, your health care provider will deliver the placenta (afterbirth) and make sure your bleeding is under control. The delivery of the placenta usually takes about 5 minutes but can take up to 30 minutes. After the placenta is delivered, a medicine may be given either by IV or injection to help contract the uterus and control bleeding. If you are planning to breastfeed, you can try to do so now. °After you deliver the placenta, your uterus should contract and get very firm. If your uterus does not remain firm, your health care provider will massage it. This is important because the contraction of the uterus helps cut off bleeding at the site where the placenta was attached  to your uterus. If your uterus does not contract properly and stay firm, you may continue to bleed heavily. If there is a lot of bleeding, medicines may be given to contract the uterus and stop the bleeding.  °  °This information is not intended to replace advice given to you by your health care provider. Make sure you discuss any questions you have with your health care provider. °  °Document Released: 10/22/2007 Document Revised: 02/02/2014 Document Reviewed: 09/09/2011 °Elsevier Interactive Patient Education ©2016 [employer] ° °

## 2015-09-05 NOTE — Progress Notes (Signed)
Subjective:  Mallory Hampton is a 25 y.o. G2P0101 at [redacted]w[redacted]d being seen today for ongoing prenatal care.  She is currently monitored for the following issues for this high-risk pregnancy and has Supervision of high-risk pregnancy; History of prior pregnancy with IUGR newborn; Gestational thrombocytopenia ([place]); and GBS (group B Streptococcus carrier), +RV culture, currently pregnant on her problem list.  Patient reports no complaints.  Contractions: Not present. Vag. Bleeding: None.  Movement: Present. Denies leaking of fluid.   The following portions of the patient's history were reviewed and updated as appropriate: allergies, current medications, past family history, past medical history, past social history, past surgical history and problem list. Problem list updated.  Objective:   Vitals:   09/05/15 0903  BP: 119/64  Pulse: 73  Weight: 144 lb (65.3 kg)    Fetal Status: Fetal Heart Rate (bpm): 146 Fundal Height: 37 cm Movement: Present     General:  Alert, oriented and cooperative. Patient is in no acute distress.  Skin: Skin is warm and dry. No rash noted.   Cardiovascular: Normal heart rate noted  Respiratory: Normal respiratory effort, no problems with respiration noted  Abdomen: Soft, gravid, appropriate for gestational age. Pain/Pressure: Absent     Pelvic:  Cervical exam deferred        Extremities: Normal range of motion.  Edema: None  Mental Status: Normal mood and affect. Normal behavior. Normal judgment and thought content.   Urinalysis: Urine Protein: 1+ Urine Glucose: Negative  Assessment and Plan:  Pregnancy: G2P0101 at [redacted]w[redacted]d  1.  Pt does not want cervical exam today 2.  EFW 88% on [place] but fundal height normal.   3.  CBC today for gestational thrombocytopenia  Term labor symptoms and general obstetric precautions including but not limited to vaginal bleeding, contractions, leaking of fluid and fetal movement were reviewed in detail with the patient. Please refer to  After Visit Summary for other counseling recommendations.  Return in 1 week (on 09/12/2015).   Guss Bunde, MD

## 2015-09-13 ENCOUNTER — Ambulatory Visit (INDEPENDENT_AMBULATORY_CARE_PROVIDER_SITE_OTHER): Payer: Medicaid Other | Admitting: Family Medicine

## 2015-09-13 ENCOUNTER — Telehealth: Payer: Self-pay | Admitting: *Deleted

## 2015-09-13 VITALS — BP 122/80 | HR 74 | Wt 143.3 lb

## 2015-09-13 DIAGNOSIS — Z8759 Personal history of other complications of pregnancy, childbirth and the puerperium: Secondary | ICD-10-CM

## 2015-09-13 DIAGNOSIS — O99113 Other diseases of the blood and blood-forming organs and certain disorders involving the immune mechanism complicating pregnancy, third trimester: Secondary | ICD-10-CM

## 2015-09-13 DIAGNOSIS — O0993 Supervision of high risk pregnancy, unspecified, third trimester: Secondary | ICD-10-CM

## 2015-09-13 DIAGNOSIS — Z8742 Personal history of other diseases of the female genital tract: Secondary | ICD-10-CM

## 2015-09-13 DIAGNOSIS — D696 Thrombocytopenia, unspecified: Secondary | ICD-10-CM | POA: Diagnosis not present

## 2015-09-13 LAB — POCT URINALYSIS DIP (DEVICE)
Bilirubin Urine: NEGATIVE
GLUCOSE, UA: NEGATIVE mg/dL
Hgb urine dipstick: NEGATIVE
Ketones, ur: NEGATIVE mg/dL
Leukocytes, UA: NEGATIVE
Nitrite: NEGATIVE
PROTEIN: NEGATIVE mg/dL
SPECIFIC GRAVITY, URINE: 1.02 (ref 1.005–1.030)
UROBILINOGEN UA: 0.2 mg/dL (ref 0.0–1.0)
pH: 6.5 (ref 5.0–8.0)

## 2015-09-13 NOTE — Telephone Encounter (Signed)
-----   Message from Guss Bunde, MD sent at 09/13/2015  8:35 AM EDT ----- Platelets stable.  Start iron for anemia.  RN to call

## 2015-09-13 NOTE — Telephone Encounter (Signed)
Pt notified concerning low Hgb.  She is to start Ferrous Sulfate daily and may need to add Colace if constipation becomes an issue.

## 2015-09-13 NOTE — Progress Notes (Signed)
IOL scheduled for 09/19/15 @ 0730.  Pt notified.

## 2015-09-13 NOTE — Progress Notes (Signed)
Subjective:  Mallory Hampton is a 25 y.o. G2P0101 at [redacted]w[redacted]d being seen today for ongoing prenatal care.  She is currently monitored for the following issues for this low-risk pregnancy and has Supervision of high-risk pregnancy; History of prior pregnancy with IUGR newborn; Gestational thrombocytopenia ([place]); and GBS (group B Streptococcus carrier), +RV culture, currently pregnant on her problem list.  Patient reports no complaints.  Contractions: Irregular. Vag. Bleeding: None.  Movement: Present. Denies leaking of fluid.   The following portions of the patient's history were reviewed and updated as appropriate: allergies, current medications, past family history, past medical history, past social history, past surgical history and problem list. Problem list updated.  Objective:   Vitals:   09/13/15 0906  BP: 122/80  Pulse: 74  Weight: 143 lb 4.8 oz (65 kg)    Fetal Status: Fetal Heart Rate (bpm): 145   Movement: Present     General:  Alert, oriented and cooperative. Patient is in no acute distress.  Skin: Skin is warm and dry. No rash noted.   Cardiovascular: Normal heart rate noted  Respiratory: Normal respiratory effort, no problems with respiration noted  Abdomen: Soft, gravid, appropriate for gestational age. Pain/Pressure: Present     Pelvic:  Cervical exam performed Dilation: 2 Effacement (%): 50 Station: -3  Extremities: Normal range of motion.  Edema: None  Mental Status: Normal mood and affect. Normal behavior. Normal judgment and thought content.   Urinalysis:      Assessment and Plan:  Pregnancy: G2P0101 at [redacted]w[redacted]d  1. Supervision of high-risk pregnancy, third trimester FHT and FH normal.  Will schedule induction.  2. History of prior pregnancy with IUGR newborn   Term labor symptoms and general obstetric precautions including but not limited to vaginal bleeding, contractions, leaking of fluid and fetal movement were reviewed in detail with the patient. Please refer to  After Visit Summary for other counseling recommendations.  No Follow-up on file.   Truett Mainland, DO

## 2015-09-14 ENCOUNTER — Inpatient Hospital Stay (HOSPITAL_COMMUNITY): Payer: Medicaid Other | Admitting: Anesthesiology

## 2015-09-14 ENCOUNTER — Encounter (HOSPITAL_COMMUNITY): Payer: Self-pay

## 2015-09-14 ENCOUNTER — Inpatient Hospital Stay (HOSPITAL_COMMUNITY)
Admission: AD | Admit: 2015-09-14 | Discharge: 2015-09-16 | DRG: 775 | Disposition: A | Discharge status: Home / Self Care | Payer: Medicaid Other | Source: Ambulatory Visit | Attending: Obstetrics and Gynecology | Admitting: Obstetrics and Gynecology

## 2015-09-14 DIAGNOSIS — O9912 Other diseases of the blood and blood-forming organs and certain disorders involving the immune mechanism complicating childbirth: Principal | ICD-10-CM | POA: Diagnosis present

## 2015-09-14 DIAGNOSIS — D6959 Other secondary thrombocytopenia: Secondary | ICD-10-CM | POA: Diagnosis present

## 2015-09-14 DIAGNOSIS — O0993 Supervision of high risk pregnancy, unspecified, third trimester: Secondary | ICD-10-CM

## 2015-09-14 DIAGNOSIS — O99824 Streptococcus B carrier state complicating childbirth: Secondary | ICD-10-CM | POA: Diagnosis present

## 2015-09-14 DIAGNOSIS — Z87891 Personal history of nicotine dependence: Secondary | ICD-10-CM | POA: Diagnosis not present

## 2015-09-14 DIAGNOSIS — Z3A40 40 weeks gestation of pregnancy: Secondary | ICD-10-CM

## 2015-09-14 DIAGNOSIS — Z8759 Personal history of other complications of pregnancy, childbirth and the puerperium: Secondary | ICD-10-CM

## 2015-09-14 DIAGNOSIS — Z3483 Encounter for supervision of other normal pregnancy, third trimester: Secondary | ICD-10-CM | POA: Diagnosis present

## 2015-09-14 DIAGNOSIS — IMO0001 Reserved for inherently not codable concepts without codable children: Secondary | ICD-10-CM

## 2015-09-14 DIAGNOSIS — D696 Thrombocytopenia, unspecified: Secondary | ICD-10-CM

## 2015-09-14 LAB — CBC
HEMATOCRIT: 33.8 % — AB (ref 36.0–46.0)
Hemoglobin: 11.6 g/dL — ABNORMAL LOW (ref 12.0–15.0)
MCH: 30.4 pg (ref 26.0–34.0)
MCHC: 34.3 g/dL (ref 30.0–36.0)
MCV: 88.5 fL (ref 78.0–100.0)
Platelets: 137 10*3/uL — ABNORMAL LOW (ref 150–400)
RBC: 3.82 MIL/uL — AB (ref 3.87–5.11)
RDW: 13.4 % (ref 11.5–15.5)
WBC: 11.8 10*3/uL — AB (ref 4.0–10.5)

## 2015-09-14 MED ORDER — ONDANSETRON HCL 4 MG/2ML IJ SOLN
4.0000 mg | Freq: Four times a day (QID) | INTRAMUSCULAR | Status: DC | PRN
  Administered 2015-09-14: 4 mg via INTRAVENOUS
  Filled 2015-09-14: qty 2

## 2015-09-14 MED ORDER — LACTATED RINGERS IV SOLN: 500.0000 mL | Freq: Once | INTRAVENOUS | Status: DC

## 2015-09-14 MED ORDER — TERBUTALINE SULFATE 1 MG/ML IJ SOLN
0.2500 mg | Freq: Once | INTRAMUSCULAR | Status: DC | PRN
  Filled 2015-09-14: qty 1

## 2015-09-14 MED ORDER — SOD CITRATE-CITRIC ACID 500-334 MG/5ML PO SOLN: 30.0000 mL | ORAL | Status: DC | PRN

## 2015-09-14 MED ORDER — OXYCODONE-ACETAMINOPHEN 5-325 MG PO TABS: 1.0000 | ORAL_TABLET | ORAL | Status: DC | PRN

## 2015-09-14 MED ORDER — FENTANYL 2.5 MCG/ML BUPIVACAINE 1/10 % EPIDURAL INFUSION (WH - ANES)
14.0000 mL/h | INTRAMUSCULAR | Status: DC | PRN
  Administered 2015-09-14: 14 mL/h via EPIDURAL
  Filled 2015-09-14: qty 125

## 2015-09-14 MED ORDER — LACTATED RINGERS IV SOLN
500.0000 mL | INTRAVENOUS | Status: DC | PRN
  Administered 2015-09-14 (×2): 500 mL via INTRAVENOUS

## 2015-09-14 MED ORDER — FENTANYL CITRATE (PF) 100 MCG/2ML IJ SOLN
100.0000 ug | INTRAMUSCULAR | Status: DC | PRN
  Administered 2015-09-14 (×2): 100 ug via INTRAVENOUS
  Filled 2015-09-14 (×2): qty 2

## 2015-09-14 MED ORDER — OXYTOCIN 40 UNITS IN LACTATED RINGERS INFUSION - SIMPLE MED
2.5000 [IU]/h | INTRAVENOUS | Status: DC
  Filled 2015-09-14: qty 1000

## 2015-09-14 MED ORDER — PHENYLEPHRINE 40 MCG/ML (10ML) SYRINGE FOR IV PUSH (FOR BLOOD PRESSURE SUPPORT)
80.0000 ug | PREFILLED_SYRINGE | INTRAVENOUS | Status: DC | PRN
  Filled 2015-09-14: qty 5

## 2015-09-14 MED ORDER — PENICILLIN G POTASSIUM 5000000 UNITS IJ SOLR
2.5000 10*6.[IU] | INTRAMUSCULAR | Status: DC
  Administered 2015-09-14 (×2): 2.5 10*6.[IU] via INTRAVENOUS
  Filled 2015-09-14 (×7): qty 2.5

## 2015-09-14 MED ORDER — IBUPROFEN 600 MG PO TABS
600.0000 mg | ORAL_TABLET | Freq: Four times a day (QID) | ORAL | Status: DC
  Administered 2015-09-14 – 2015-09-16 (×8): 600 mg via ORAL
  Filled 2015-09-14 (×8): qty 1

## 2015-09-14 MED ORDER — LACTATED RINGERS IV SOLN
INTRAVENOUS | Status: DC
  Administered 2015-09-14: 18:00:00 via INTRAVENOUS

## 2015-09-14 MED ORDER — EPHEDRINE 5 MG/ML INJ
10.0000 mg | INTRAVENOUS | Status: DC | PRN
  Filled 2015-09-14: qty 4

## 2015-09-14 MED ORDER — DIPHENHYDRAMINE HCL 50 MG/ML IJ SOLN
12.5000 mg | INTRAMUSCULAR | Status: DC | PRN
Start: 2015-09-14 — End: 2015-09-16

## 2015-09-14 MED ORDER — LIDOCAINE HCL (PF) 1 % IJ SOLN
30.0000 mL | INTRAMUSCULAR | Status: DC | PRN
Start: 2015-09-14 — End: 2015-09-16
  Filled 2015-09-14: qty 30

## 2015-09-14 MED ORDER — OXYTOCIN 40 UNITS IN LACTATED RINGERS INFUSION - SIMPLE MED
1.0000 m[IU]/min | INTRAVENOUS | Status: DC
  Administered 2015-09-14: 2 m[IU]/min via INTRAVENOUS

## 2015-09-14 MED ORDER — FLEET ENEMA 7-19 GM/118ML RE ENEM: 1.0000 | ENEMA | RECTAL | Status: DC | PRN

## 2015-09-14 MED ORDER — ACETAMINOPHEN 325 MG PO TABS
650.0000 mg | ORAL_TABLET | ORAL | Status: DC | PRN
  Administered 2015-09-15: 650 mg via ORAL
  Filled 2015-09-14: qty 2

## 2015-09-14 MED ORDER — FENTANYL CITRATE (PF) 100 MCG/2ML IJ SOLN
100.0000 ug | Freq: Once | INTRAMUSCULAR | Status: AC
  Administered 2015-09-14: 100 ug via INTRAVENOUS
  Filled 2015-09-14: qty 2

## 2015-09-14 MED ORDER — LIDOCAINE HCL (PF) 1 % IJ SOLN
INTRAMUSCULAR | Status: DC | PRN
  Administered 2015-09-14: 4 mL via EPIDURAL
  Administered 2015-09-14: 6 mL via EPIDURAL

## 2015-09-14 MED ORDER — PHENYLEPHRINE 40 MCG/ML (10ML) SYRINGE FOR IV PUSH (FOR BLOOD PRESSURE SUPPORT)
80.0000 ug | PREFILLED_SYRINGE | INTRAVENOUS | Status: DC | PRN
  Filled 2015-09-14: qty 5
  Filled 2015-09-14: qty 10

## 2015-09-14 MED ORDER — MISOPROSTOL 200 MCG PO TABS
ORAL_TABLET | ORAL | Status: AC
  Filled 2015-09-14: qty 5

## 2015-09-14 MED ORDER — OXYTOCIN BOLUS FROM INFUSION
500.0000 mL | Freq: Once | INTRAVENOUS | Status: AC
  Administered 2015-09-14: 500 mL via INTRAVENOUS

## 2015-09-14 MED ORDER — OXYCODONE-ACETAMINOPHEN 5-325 MG PO TABS
2.0000 | ORAL_TABLET | ORAL | Status: DC | PRN
Start: 2015-09-14 — End: 2015-09-16

## 2015-09-14 MED ORDER — LACTATED RINGERS IV SOLN
INTRAVENOUS | Status: DC
  Administered 2015-09-14: 07:00:00 via INTRAVENOUS

## 2015-09-14 MED ORDER — DEXTROSE 5 % IV SOLN
5.0000 10*6.[IU] | Freq: Once | INTRAVENOUS | Status: AC
  Administered 2015-09-14: 5 10*6.[IU] via INTRAVENOUS
  Filled 2015-09-14: qty 5

## 2015-09-14 NOTE — Anesthesia Pain Management Evaluation Note (Signed)
  CRNA Pain Management Visit Note  Patient: Mallory Hampton, 25 y.o., female  "Hello I am a member of the anesthesia team at [facility]. We have an anesthesia team available at all times to provide care throughout the hospital, including epidural management and anesthesia for C-section. I don't know your plan for the delivery whether it a natural birth, water birth, IV sedation, nitrous supplementation, doula or epidural, but we want to meet your pain goals."   1.Was your pain managed to your expectations on prior hospitalizations?   Yes   2.What is your expectation for pain management during this hospitalization?     Epidural  3.How can we help you reach that goal? epid  Record the patient's initial score and the patient's pain goal.   Pain: 6  Pain Goal: 6 [facility] wants you to be able to say your pain was always managed very well.  Scarlettrose Costilow 09/14/2015

## 2015-09-14 NOTE — Anesthesia Procedure Notes (Signed)
Epidural Patient location during procedure: OB  Staffing Anesthesiologist: Charise Leinbach Performed: anesthesiologist   Preanesthetic Checklist Completed: patient identified, site marked, surgical consent, pre-op evaluation, timeout performed, IV checked, risks and benefits discussed and monitors and equipment checked  Epidural Patient position: sitting Prep: site prepped and draped and DuraPrep Patient monitoring: continuous pulse ox and blood pressure Approach: midline Location: L3-L4 Injection technique: LOR saline  Needle:  Needle type: Tuohy  Needle gauge: 17 G Needle length: 9 cm and 9 Needle insertion depth: 6 cm Catheter type: closed end flexible Catheter size: 19 Gauge Catheter at skin depth: 10 cm Test dose: negative  Assessment Sensory level: T8 Events: blood not aspirated, injection not painful, no injection resistance, negative IV test and no paresthesia  Additional Notes Patient identified. Risks/Benefits/Options discussed with patient including but not limited to bleeding, infection, nerve damage, paralysis, failed block, incomplete pain control, headache, blood pressure changes, nausea, vomiting, reactions to medications, itching and postpartum back pain. Confirmed with bedside nurse the patient's most recent platelet count. Confirmed with patient that they are not currently taking any anticoagulation, have any bleeding history or any family history of bleeding disorders. Patient expressed understanding and wished to proceed. All questions were answered. Sterile technique was used throughout the entire procedure. Please see nursing notes for vital signs. Test dose was given through epidural catheter and negative prior to continuing to dose epidural or start infusion. Warning signs of high block given to the patient including shortness of breath, tingling/numbness in hands, complete motor block, or any concerning symptoms with instructions to call for help. Patient was  given instructions on fall risk and not to get out of bed. All questions and concerns addressed with instructions to call with any issues or inadequate analgesia.  Reason for block:procedure for pain

## 2015-09-14 NOTE — H&P (Signed)
LABOR ADMISSION HISTORY AND PHYSICAL  Mallory Hampton is a 25 y.o. female G68P0101 with IUP at [redacted]w[redacted]d by 20 week U/S presenting for active labor.  She reports +FM, + contractions, No LOF, no VB, no blurry vision, headaches or peripheral edema, and RUQ pain.  She plans on breast and bottle feeding. She request Depo for birth control.  Dating: By  20 week U/S --->  Estimated Date of Delivery: 09/08/15  Sono:  @35w , CWD, normal anatomy, cephalic presentation, [id number] g, 53 % EFW   Prenatal History/Complications: History of prior pregnancy with IUGR newborn;  Gestational thrombocytopenia ([facility])  GBS (group B Streptococcus carrier), +RV culture,   Past Medical History: Past Medical History:  Diagnosis Date  . Medical history non-contributory     Past Surgical History: Past Surgical History:  Procedure Laterality Date  . MOUTH SURGERY      Obstetrical History: OB History    Gravida Para Term Preterm AB Living   2 1   1   1    SAB TAB Ectopic Multiple Live Births           1      Social History: Social History   Social History  . Marital status: Single    Spouse name: N/A  . Number of children: N/A  . Years of education: N/A   Social History Main Topics  . Smoking status: Former Research scientist (life sciences)  . Smokeless tobacco: Never Used  . Alcohol use No  . Drug use:     Types: Marijuana     Comment: + drug screen 03/18/15  . Sexual activity: Not Asked   Other Topics Concern  . None   Social History Narrative  . None    Family History: History reviewed. No pertinent family history.  Allergies: No Known Allergies  Prescriptions Prior to Admission  Medication Sig Dispense Refill Last Dose  . prenatal vitamin w/FE, FA (PRENATAL 1 + 1) 27-1 MG TABS tablet Take 1 tablet by mouth daily at 12 noon.    09/13/2015 at Unknown time  . ferrous sulfate 325 (65 FE) MG tablet Take 325 mg by mouth daily with breakfast.        Review of Systems   All systems reviewed and negative except as  stated in HPI  BP 130/81   Pulse 67   Temp 98.2 F (36.8 C) (Oral)   Resp 18   Ht 5\' 4"  (1.626 m)   Wt 65.3 kg (144 lb)   BMI 24.72 kg/m  General appearance: alert, cooperative and no distress Lungs: no respiratory distress Heart: regular rate and rhythm Abdomen: soft, non-tender; bowel sounds normal Extremities: Homans sign is negative, no sign of DVT, edema Presentation: cephalic on cervical exam by Knute Neu CNM Fetal monitoringBaseline: 150 bpm, Variability: Good {> 6 bpm), Accelerations: Reactive and Decelerations: Absent Uterine activityFrequency: Every 2-3 minutes Dilation:  (unable to reach) Effacement (%): 80 Station: -3 Exam by:: Glade Nurse, RN   Prenatal labs: ABO, Rh: --/--/A POS (08/19 0725) Antibody: PENDING (08/19 0725) Rubella: Immune RPR: NON REAC (06/12 0859)  HBsAg: Negative (02/20 0000)  HIV: NONREACTIVE (06/12 0859)  GBS:   Positive  1 hr Glucola 94 Genetic screening  Normal  Anatomy US normal   Prenatal Transfer Tool  Maternal Diabetes: No Genetic Screening: Normal Maternal Ultrasounds/Referrals: Normal Fetal Ultrasounds or other Referrals:  None Maternal Substance Abuse:  No Significant Maternal Medications:  None Significant Maternal Lab Results: Lab values include: Group B Strep positive  Results for orders  placed or performed during the hospital encounter of 09/14/15 (from the past 24 hour(s))  CBC   Collection Time: 09/14/15  7:25 AM  Result Value Ref Range   WBC 11.8 (H) 4.0 - 10.5 K/uL   RBC 3.82 (L) 3.87 - 5.11 MIL/uL   Hemoglobin 11.6 (L) 12.0 - 15.0 g/dL   HCT 33.8 (L) 36.0 - 46.0 %   MCV 88.5 78.0 - 100.0 fL   MCH 30.4 26.0 - 34.0 pg   MCHC 34.3 30.0 - 36.0 g/dL   RDW 13.4 11.5 - 15.5 %   Platelets PENDING 150 - 400 K/uL  Type and screen [place]   Collection Time: 09/14/15  7:25 AM  Result Value Ref Range   ABO/RH(D) A POS    Antibody Screen PENDING    Sample Expiration 09/17/2015   Results for  orders placed or performed in visit on 09/13/15 (from the past 24 hour(s))  POCT urinalysis dip (device)   Collection Time: 09/13/15  9:46 AM  Result Value Ref Range   Glucose, UA NEGATIVE NEGATIVE mg/dL   Bilirubin Urine NEGATIVE NEGATIVE   Ketones, ur NEGATIVE NEGATIVE mg/dL   Specific Gravity, Urine 1.020 1.005 - 1.030   Hgb urine dipstick NEGATIVE NEGATIVE   pH 6.5 5.0 - 8.0   Protein, ur NEGATIVE NEGATIVE mg/dL   Urobilinogen, UA 0.2 0.0 - 1.0 mg/dL   Nitrite NEGATIVE NEGATIVE   Leukocytes, UA NEGATIVE NEGATIVE    Patient Active Problem List   Diagnosis Date Noted  . Active labor at term 09/14/2015  . GBS (group B Streptococcus carrier), +RV culture, currently pregnant 08/06/2015  . Gestational thrombocytopenia ([place]) 07/22/2015  . History of prior pregnancy with IUGR newborn 07/08/2015  . Supervision of high-risk pregnancy 06/05/2015    Assessment: Mallory Hampton is a 25 y.o. G2P0101 at [redacted]w[redacted]d here for active labor. Patient with gestational thrombocytopenia.   #Labor:expectant management #Pain: Fentanyl and nitrous oxide #FWB: Category I #ID: GBS positive - PCN #MOF: Breast and Bottle  #MOC: Depo  #Circ: Yes as outpatient   Bufford Lope, DO PGY-1, [facility] 09/14/2015 4:57 PM    I spoke with and examined patient and agree with resident/PA/SNM's note and plan of care.  Roma Schanz, CNM, [facility] 09/14/2015 4:58 PM

## 2015-09-14 NOTE — Anesthesia Preprocedure Evaluation (Addendum)
Anesthesia Evaluation  Patient identified by MRN, date of birth, ID band Patient awake    Reviewed: Allergy & Precautions, H&P , NPO status , Patient's Chart, lab work & pertinent test results  Airway Mallampati: II  TM Distance: >3 FB Neck ROM: full    Dental no notable dental hx.    Pulmonary former smoker,    Pulmonary exam normal breath sounds clear to auscultation       Cardiovascular negative cardio ROS Normal cardiovascular exam Rhythm:regular Rate:Normal     Neuro/Psych negative neurological ROS  negative psych ROS   GI/Hepatic negative GI ROS, Neg liver ROS,   Endo/Other  negative endocrine ROS  Renal/GU negative Renal ROS  negative genitourinary   Musculoskeletal   Abdominal   Peds  Hematology negative hematology ROS (+)   Anesthesia Other Findings Pregnancy - uncomplicated Platelets and allergies reviewed Denies active cardiac or pulmonary symptoms, METS > 4  Denies blood thinning medications, bleeding disorders, hypertension, asthma, supine hypotension syndrome, previous anesthesia difficulties   Reproductive/Obstetrics (+) Pregnancy                             Anesthesia Physical Anesthesia Plan  ASA: II  Anesthesia Plan: Epidural   Post-op Pain Management:    Induction:   Airway Management Planned:   Additional Equipment:   Intra-op Plan:   Post-operative Plan:   Informed Consent: I have reviewed the patients History and Physical, chart, labs and discussed the procedure including the risks, benefits and alternatives for the proposed anesthesia with the patient or authorized representative who has indicated his/her understanding and acceptance.     Plan Discussed with:   Anesthesia Plan Comments:        Anesthesia Quick Evaluation

## 2015-09-14 NOTE — Progress Notes (Signed)
Patient ID: Mallory Hampton, female   DOB: 07-02-1990, 25 y.o.   MRN: [id number] Mallory Hampton is a 25 y.o. G2P0101 at [redacted]w[redacted]d admitted for early labor  Subjective: Comfortable w/ epidural  Objective: BP 111/63   Pulse 83   Temp 98.6 F (37 C) (Oral)   Resp 18   Ht 5\' 4"  (1.626 m)   Wt 65.3 kg (144 lb)   BMI 24.72 kg/m  Total I/O In: -  Out: 200 [Urine:200]  FHT:  FHR: 125 bpm, variability: moderate,  accelerations:  Present,  decelerations:  Absent UC:   q 2-24mins  SVE:  10/100/0 per RN  Pushed x~1hr w/o descent, station 0, per nurse laboring down for awhile  Labs: Lab Results  Component Value Date   WBC 11.8 (H) 09/14/2015   HGB 11.6 (L) 09/14/2015   HCT 33.8 (L) 09/14/2015   MCV 88.5 09/14/2015   PLT 137 (L) 09/14/2015    Assessment / Plan: Now complete, pushed x ~1hr w/o descent, only 0 station, will labor down and add pitocin per protcol  Labor: laboring down Fetal Wellbeing:  Category I Pain Control:  Epidural Pre-eclampsia: n/a I/D:  pcn for gbs+ Anticipated MOD:  NSVD  Tawnya Crook CNM, WHNP-BC 09/14/2015, 5:46 PM

## 2015-09-14 NOTE — MAU Note (Signed)
Contractions since 10 pm. Bloody show. Baby moving well. No leaking.

## 2015-09-14 NOTE — Progress Notes (Signed)
Patient ID: Mallory Hampton, female   DOB: 12-Aug-1990, 25 y.o.   MRN: [id number] Mallory Hampton is a 25 y.o. G2P0101 at [redacted]w[redacted]d admitted for early labor  Subjective: Very uncomfortable w/ uc's, deciding between more fentanyl vs. Nitrous, last fentanyl 1hr ago- has to wait 1 more hr to be able to get nitrous. Decided to go ahead w/ more fentanyl and arom.  Reports h/o retained placenta w/ manual removal w/ PPH w/ last birth in [place]- denies h/o blood or other blood product transfusion.   Objective: BP 120/88 (BP Location: Left Arm)   Pulse 77   Temp 98.1 F (36.7 C) (Oral)   Resp 18   Ht 5\' 4"  (1.626 m)   Wt 65.3 kg (144 lb)   BMI 24.72 kg/m  No intake/output data recorded.  FHT:  FHR: 135 bpm, variability: moderate,  accelerations:  Present,  decelerations:  Absent UC:   regular, every 2-3 minutes  SVE:   Dilation: 3.5 Effacement (%): 80 Station: -2 Exam by:: Knute Neu, CNM AROM small amt clear fluid  Labs: Lab Results  Component Value Date   WBC 11.8 (H) 09/14/2015   HGB 11.6 (L) 09/14/2015   HCT 33.8 (L) 09/14/2015   MCV 88.5 09/14/2015   PLT 137 (L) 09/14/2015    Assessment / Plan: Early labor, very uncomfortable w/ uc's, requests more fentanyl, now arom'd. Bloodback called and reports +Kid antibodies- checking around for compatible units  Labor: early Fetal Wellbeing:  Category I Pain Control:  IV pain meds Pre-eclampsia: n/a I/D:  pcn for gbs +, has received 2doses now Anticipated MOD:  NSVD  Tawnya Crook CNM, WHNP-BC 09/14/2015, 1:02 PM

## 2015-09-14 NOTE — Progress Notes (Signed)
Patient ID: Mallory Hampton, female   DOB: 11/09/1990, 25 y.o.   MRN: [id number] Mallory Hampton is a 25 y.o. G2P0101 at [redacted]w[redacted]d admitted for early labor  Subjective: Uncomfortable w/ uc's, has gotten 2 doses of IV Fentanyl which helps, interested in nitrous, does not want epidural  Objective: BP 130/81   Pulse 67   Temp 98.2 F (36.8 C) (Oral)   Resp 18   Ht 5\' 4"  (1.626 m)   Wt 65.3 kg (144 lb)   BMI 24.72 kg/m  No intake/output data recorded.  FHT:  FHR: 135 bpm, variability: moderate,  accelerations:  Present,  decelerations:  Absent UC:   regular, every 1-3 minutes  SVE:   Dilation: 3 Effacement (%): 80 Station: -2 Exam by:: Knute Neu, CNM Intact  Pitocin @ 0 mu/min  Labs: Lab Results  Component Value Date   WBC 11.8 (H) 09/14/2015   HGB 11.6 (L) 09/14/2015   HCT 33.8 (L) 09/14/2015   MCV 88.5 09/14/2015   PLT 137 (L) 09/14/2015    Assessment / Plan: Early spontaneous labor, GBS+ so will wait until receives 2nd dose of pcn for arom augmentation  Gestational thrombocytopenia, plts up to 137 from last count of 110 9d ago  Labor: early Fetal Wellbeing:  Category I Pain Control:  IV pain meds and interested in nitrous, understands can not use both together Pre-eclampsia: n/a I/D:  pcn for gbs+ Anticipated MOD:  NSVD  Tawnya Crook CNM, WHNP-BC 09/14/2015, 9:48 AM

## 2015-09-14 NOTE — Progress Notes (Signed)
Patient ID: Mallory Hampton, female   DOB: 1990/10/01, 25 y.o.   MRN: [id number] Mallory Hampton is a 25 y.o. G2P0101 at [redacted]w[redacted]d admitted for early labor  Subjective: Now comfortable w/ epidural  Objective: BP 106/77   Pulse 82   Temp 98.3 F (36.8 C) (Oral)   Resp 18   Ht 5\' 4"  (1.626 m)   Wt 65.3 kg (144 lb)   BMI 24.72 kg/m  No intake/output data recorded.  FHT:  FHR: 140 bpm, variability: moderate,  accelerations:  Present,  decelerations:  Absent UC:   regular, every 2-3 minutes  SVE:   Dilation: 7.5 Effacement (%): 90 Station: -1 Exam by:: Glade Nurse, RN   Labs: Lab Results  Component Value Date   WBC 11.8 (H) 09/14/2015   HGB 11.6 (L) 09/14/2015   HCT 33.8 (L) 09/14/2015   MCV 88.5 09/14/2015   PLT 137 (L) 09/14/2015    Assessment / Plan: Spontaneous labor, progressing normally  Labor: Progressing normally Fetal Wellbeing:  Category I Pain Control:  Epidural Pre-eclampsia: n/a I/D:  pcn for gbs+ Anticipated MOD:  NSVD  Tawnya Crook CNM, WHNP-BC 09/14/2015, 3:29 PM

## 2015-09-15 LAB — CBC
HCT: 29.4 % — ABNORMAL LOW (ref 36.0–46.0)
HEMOGLOBIN: 10.1 g/dL — AB (ref 12.0–15.0)
MCH: 30.3 pg (ref 26.0–34.0)
MCHC: 34.4 g/dL (ref 30.0–36.0)
MCV: 88.3 fL (ref 78.0–100.0)
Platelets: 121 10*3/uL — ABNORMAL LOW (ref 150–400)
RBC: 3.33 MIL/uL — AB (ref 3.87–5.11)
RDW: 13.7 % (ref 11.5–15.5)
WBC: 13.3 10*3/uL — AB (ref 4.0–10.5)

## 2015-09-15 LAB — RPR: RPR: NONREACTIVE

## 2015-09-15 MED ORDER — ONDANSETRON HCL 4 MG/2ML IJ SOLN: 4.0000 mg | INTRAMUSCULAR | Status: DC | PRN

## 2015-09-15 MED ORDER — DIPHENHYDRAMINE HCL 25 MG PO CAPS: 25.0000 mg | ORAL_CAPSULE | Freq: Four times a day (QID) | ORAL | Status: DC | PRN

## 2015-09-15 MED ORDER — WITCH HAZEL-GLYCERIN EX PADS: 1.0000 "application " | MEDICATED_PAD | CUTANEOUS | Status: DC | PRN

## 2015-09-15 MED ORDER — ZOLPIDEM TARTRATE 5 MG PO TABS: 5.0000 mg | ORAL_TABLET | Freq: Every evening | ORAL | Status: DC | PRN

## 2015-09-15 MED ORDER — PRENATAL MULTIVITAMIN CH
1.0000 | ORAL_TABLET | Freq: Every day | ORAL | Status: DC
  Administered 2015-09-15 – 2015-09-16 (×2): 1 via ORAL
  Filled 2015-09-15 (×2): qty 1

## 2015-09-15 MED ORDER — TETANUS-DIPHTH-ACELL PERTUSSIS 5-2.5-18.5 LF-MCG/0.5 IM SUSP: 0.5000 mL | Freq: Once | INTRAMUSCULAR | Status: DC

## 2015-09-15 MED ORDER — BENZOCAINE-MENTHOL 20-0.5 % EX AERO: 1.0000 "application " | INHALATION_SPRAY | CUTANEOUS | Status: DC | PRN

## 2015-09-15 MED ORDER — SIMETHICONE 80 MG PO CHEW: 80.0000 mg | CHEWABLE_TABLET | ORAL | Status: DC | PRN

## 2015-09-15 MED ORDER — SENNOSIDES-DOCUSATE SODIUM 8.6-50 MG PO TABS
2.0000 | ORAL_TABLET | ORAL | Status: DC
  Administered 2015-09-15: 2 via ORAL
  Filled 2015-09-15: qty 2

## 2015-09-15 MED ORDER — ONDANSETRON HCL 4 MG PO TABS: 4.0000 mg | ORAL_TABLET | ORAL | Status: DC | PRN

## 2015-09-15 MED ORDER — ACETAMINOPHEN 325 MG PO TABS: 650.0000 mg | ORAL_TABLET | ORAL | Status: DC | PRN

## 2015-09-15 MED ORDER — DIBUCAINE 1 % RE OINT: 1.0000 "application " | TOPICAL_OINTMENT | RECTAL | Status: DC | PRN

## 2015-09-15 MED ORDER — COCONUT OIL OIL: 1.0000 "application " | TOPICAL_OIL | Status: DC | PRN

## 2015-09-15 NOTE — Anesthesia Postprocedure Evaluation (Signed)
Anesthesia Post Note  Patient: Mallory Hampton  Procedure(s) Performed: * No procedures listed *  Patient location during evaluation: Mother Baby Anesthesia Type: Epidural Level of consciousness: awake and alert and oriented Pain management: satisfactory to patient Vital Signs Assessment: post-procedure vital signs reviewed and stable Respiratory status: spontaneous breathing and nonlabored ventilation Cardiovascular status: stable Postop Assessment: no headache, no backache, no signs of nausea or vomiting, adequate PO intake and patient able to bend at knees (patient up walking) Anesthetic complications: no     Last Vitals:  Vitals:   09/14/15 2320 09/15/15 0330  BP: 112/67 114/76  Pulse: 72 74  Resp: 18 18  Temp: 37.1 C 36.9 C    Last Pain:  Vitals:   09/15/15 0330  TempSrc: Oral  PainSc: 0-No pain   Pain Goal: Patients Stated Pain Goal: 3 (09/14/15 1732)               Willa Rough

## 2015-09-15 NOTE — Progress Notes (Signed)
Assisted patient out of bed with the stedy to the bathroom. Voided 400 of clear yellow urine. Returned to be with stedy. She states she is able to bear weight on her legs but is still unable to feel her right knee, she states she will call out for my assistance to the bathroom.

## 2015-09-15 NOTE — Plan of Care (Signed)
Problem: Nutritional: Goal: Mother's verbalization of comfort with breastfeeding process will improve Outcome: Progressing  The infant has not yet latched successfully to the mother's breasts. As of 1130, the infant had been spoon-fed small amounts of colostrum expressed with a hand pump. He has been sleepy. I encouraged the mother to ask for assistance with latching the infant to the breast and to attempt to wake the infant and breast feed him skin-to-skin if it had been three hours since his last feeding.   I set up and demonstrated the use of a double-electric breast pump earlier this morning for the mother's use. The first time that she used it she expressed about 4 ml of colostrum. At approximately 1130, I demonstrated hand-expression of colostrum. She returned the demonstration and we hand-expressed an additional 10 cc of colostrum at that time. Mother encouraged to rub a small amount of colostrum on her nipples after breastfeeding or pumping to prevent infection and promote healing.

## 2015-09-15 NOTE — Progress Notes (Signed)
Patient transferred to my care into room 131. Oriented to room, call bell within reach. Patient states she is not in any pain at this time. Reviewed infant safety paper and plan of care. She verbalizes her understanding and states she will not get out of bed without my assistance.

## 2015-09-15 NOTE — Lactation Note (Signed)
This note was copied from a baby's chart. Lactation Consultation Note: Mother has been spoon feeding infant with hand expressed colostrum. Mother reports that infant has not sustained a latch. Mother has a colostrum bullet at the beside. Advised mother to page for Lactation to come and assist with the next feeding. Mother informed of the available lactation services.lactation brochure given with basic teaching done.  Patient Name: Boy Allisa Jury [id number] Date: 09/15/2015 Reason for consult: Initial assessment   Maternal Data    Feeding Feeding Type: Breast Milk  LATCH Score/Interventions                      Lactation Tools Discussed/Used     Consult Status Consult Status: Follow-up    Jess Barters [facility] 09/15/2015, 3:33 PM

## 2015-09-15 NOTE — Progress Notes (Signed)
Post Partum Day 1 Subjective: no complaints, up ad lib, voiding, tolerating PO and patient reports the pain has improved and is having an easier time moving around. She is breast feeding but reports baby had difficulty latching so she is working with lactation. Patient would like a circumcision (done as outpatient) but would like the list of possible locations before discharge.  Objective: Blood pressure 112/67, pulse 72, temperature 98.7 F (37.1 C), temperature source Oral, resp. rate 18, height 5\' 4"  (1.626 m), weight 65.3 kg (144 lb), currently breastfeeding.  Physical Exam:  General: alert, cooperative and no distress Lochia: appropriate Uterine Fundus: firm DVT Evaluation: No evidence of DVT seen on physical exam.   Recent Labs  09/14/15 0725  HGB 11.6*  HCT 33.8*    Assessment/Plan: Plan for discharge tomorrow  - circumcision as outpatient, provide list of options. - breast feeding. - depo for contraception.   LOS: 1 day   Mallory Hampton 09/15/2015, 6:43 AM

## 2015-09-15 NOTE — Progress Notes (Signed)
Post Partum Day 1  Subjective:  Mallory Hampton is a 25 y.o. [id number] [redacted]w[redacted]d s/p SVD.  No acute events overnight.  Pt denies problems with ambulating, voiding or po intake.  She denies nausea or vomiting.  Pain is well controlled.  She has not had flatus. She has not had bowel movement.  Lochia Minimal.  Plan for birth control is Depo-Provera.  Method of Feeding: Breast and bottle  Objective: BP 114/76 (BP Location: Left Arm)   Pulse 74   Temp 98.4 F (36.9 C) (Oral)   Resp 18   Ht 5\' 4"  (1.626 m)   Wt 65.3 kg (144 lb)   Breastfeeding? Unknown   BMI 24.72 kg/m   Physical Exam:  General: alert, cooperative and no distress Lochia:normal flow Chest: CTAB Heart: RRR no m/r/g Abdomen: +BS, soft, nontender, fundus firm at/below umbilicus Uterine Fundus: firm,  DVT Evaluation: No evidence of DVT seen on physical exam. Extremities: no edema   Recent Labs  09/14/15 0725 09/15/15 0545  HGB 11.6* 10.1*  HCT 33.8* 29.4*    Assessment/Plan:  ASSESSMENT: Mallory Hampton is a 25 y.o. G2P1102 [redacted]w[redacted]d ppd #1 s/p NSVD/VAVD/FAVD doing well.   Plan for discharge tomorrow   LOS: 1 day   Terry 09/15/2015, 7:53 AM

## 2015-09-16 ENCOUNTER — Other Ambulatory Visit: Payer: Medicaid Other | Admitting: Obstetrics & Gynecology

## 2015-09-16 MED ORDER — SENNOSIDES-DOCUSATE SODIUM 8.6-50 MG PO TABS: 2.0000 | ORAL_TABLET | ORAL | 0 refills | Status: AC

## 2015-09-16 MED ORDER — IBUPROFEN 600 MG PO TABS: 600.0000 mg | ORAL_TABLET | Freq: Four times a day (QID) | ORAL | 0 refills | Status: AC

## 2015-09-16 NOTE — Discharge Instructions (Signed)

## 2015-09-16 NOTE — Discharge Summary (Signed)
OB Discharge Summary     Patient Name: Mallory Hampton DOB: 1990/04/15 MRN: [id number]  Date of admission: 09/14/2015 Delivering MD: Wells Guiles R   Date of discharge: 09/16/2015  Admitting diagnosis: 42 WEEKS CONTRACTIONS AND BLEEDING Intrauterine pregnancy: [redacted]w[redacted]d     Secondary diagnosis:  Active Problems:   Active labor at term  Additional problems: h/o prior pregnancy with IUGR, gestational thrombocytopenia     Discharge diagnosis: Term Pregnancy Delivered                                                                                                Post partum procedures:none  Augmentation: AROM and Pitocin  Complications: None  Hospital course:  Onset of Labor With Vaginal Delivery     25 y.o. yo [id number] at [redacted]w[redacted]d was admitted in Active Labor on 09/14/2015. Patient had an uncomplicated labor course as follows:  Membrane Rupture Time/Date: 12:52 PM ,09/14/2015   Intrapartum Procedures: Episiotomy: None [1]                                         Lacerations:  None [1]  Patient had a delivery of a Viable infant. 09/14/2015  Information for the patient's newborn:  Euna, Kender [id number]  Delivery Method: Vag-Spont    Pateint had an uncomplicated postpartum course.  She is ambulating, tolerating a regular diet, passing flatus, and urinating well. Patient is discharged home in stable condition on 09/16/15.    Physical exam  Vitals:   09/15/15 0330 09/15/15 0924 09/15/15 1800 09/16/15 0525  BP: 114/76 112/72 121/86 123/87  Pulse: 74 71 65 69  Resp: 18 (!) 22 18 18   Temp: 98.4 F (36.9 C) 98.4 F (36.9 C) 98.8 F (37.1 C) 98.5 F (36.9 C)  TempSrc: Oral Oral Oral Oral  SpO2:  99% 99%   Weight:      Height:       General: alert, cooperative and no distress Lochia: appropriate Uterine Fundus: firm DVT Evaluation: No evidence of DVT seen on physical exam. Negative Homan's sign. Labs: Lab Results  Component Value Date   WBC 13.3 (H) 09/15/2015   HGB 10.1 (L) 09/15/2015   HCT 29.4 (L) 09/15/2015   MCV 88.3 09/15/2015   PLT 121 (L) 09/15/2015   No flowsheet data found.  Discharge instruction: per After Visit Summary and "Baby and Me Booklet".  After visit meds:    Medication List    TAKE these medications   ferrous sulfate 325 (65 FE) MG tablet Take 325 mg by mouth daily with breakfast.   ibuprofen 600 MG tablet Commonly known as:  ADVIL,MOTRIN Take 1 tablet (600 mg total) by mouth every 6 (six) hours.   prenatal vitamin w/FE, FA 27-1 MG Tabs tablet Take 1 tablet by mouth daily at 12 noon.   senna-docusate 8.6-50 MG tablet Commonly known as:  Senokot-S Take 2 tablets by mouth daily.       Diet: routine diet  Activity: Advance as tolerated. Pelvic rest for 6  weeks.   Outpatient follow up:6 weeks Follow up Appt: Future Appointments Date Time Provider [place]  09/19/2015 7:30 AM [facility] ROOM [facility] None  10/17/2015 9:40 AM Chancy Milroy, MD [facility]   Follow up Visit: [place] for [place] Follow up in 6 week(s).   Specialty:  Obstetrics and Gynecology Why:  for postpartum visit Contact information: [place] [phone]       [place] .   Why:  as needed for any potential emergencies Contact information: [place] [phone]          Postpartum contraception: Depo Provera  Newborn Data: Live born female  Birth Weight: 8 lb 1.3 oz (3666 g) APGAR: 8, 9  Baby Feeding: Bottle and Breast Disposition:home with mother   09/16/2015 Bufford Lope, DO   Midwife attestation I have seen and examined this patient and agree with above documentation in the resident's note.   Mallory Hampton is a 25 y.o. [id number] s/p NSVD.   Pain is well controlled.  Plan for birth control is Depo-Provera.  Method of Feeding: bottle and breast  PE:   BP 123/87 (BP Location: Right Arm)   Pulse 69   Temp 98.5 F (36.9 C) (Oral)   Resp 18   Ht 5\' 4"  (1.626 m)   Wt 65.3 kg (144 lb)   SpO2 99%   Breastfeeding? Unknown   BMI 24.72 kg/m  Gen: well appearing Heart: reg rate Lungs: normal WOB Fundus firm Ext: soft, no pain, no edema   Recent Labs  09/14/15 0725 09/15/15 0545  HGB 11.6* 10.1*  HCT 33.8* 29.4*     Plan: discharge today - postpartum care discussed --LARCs reviewed as most effective forms of contraception.  Pt interested but undecided.  Has PP visit scheduled and will discuss again at that time.  Discussed use of barrier methods or coming to office for Depo early if plan to become sexually active before PP visit.  --Baby may stay so mom staying as Baby patient in her room   LEFTWICH-KIRBY, Tasneem Cormier, CNM 10:22 AM

## 2015-09-16 NOTE — Lactation Note (Signed)
This note was copied from a baby's chart. Lactation Consultation Note  Patient Name: Mallory Hampton [id number] Date: 09/16/2015 Reason for consult: Follow-up assessment;Hyperbilirubinemia   Follow up with mom of 47 hour old infant. Infant with 3 BF for 10-30 minutes, 3 attempts, 4 EBM Supplementation of 5-13 cc via spoon, 3 voids and 3 stools in 24 hours preceding this assessment. LATCH Scores 6-8 by Bedside RN. Infant weight 7 lb 11.5 oz with weight loss of 4% since  Mom reports infant is sleepy at the breast, she reports he BF for 5 minutes about 40 minutes ago and then she pumped 5 cc Colostrum which she spoon fed him. He was laying in mom's arms in a deep sleep.  We awakened infant by undressing him and stimulating him and placed him to left breast in football hold. I was able to hand express several gtts of colostrum that infant would lick up. He would not open mouth. Had him suck on a gloved finger for about 15 seconds and then was able to latch him to breast. He was noted to have rhythmic sucking and intermittent swallows. Enc mom to massage breast with feeding to maximize milk transfer. He continued to nurse for 20 minutes and was still feeding when I left the room. He was noted to have increased swallows with breast massage. Infant did need stimulation throughout feeding. Enc mom to stimulate infant as needed for feeding.  Mom is concerned with milk supply. Colostrum is easy to hand express and she is able to pump 5-10 cc with a manual pump. Enc mom to BF infant and to pump with DEBP 8-12 x a day for 15 minutes on Initiate setting after BF. All EBM should be fed to infant. Mom is spoon feeding infant colostrum. Enc her to follow DEBP with hand expression or manual pump to get colostrum to feed infant. She has manual and DEBP set up in room, settings and milk storage reviewed.  Parents are wanting to take infant home. Infant with follow up bili at 5 pm and 5 am.   Mom does not have a DEBP at  home, she plans to call [facility] to see when a pump will be available. Enc mom to call out to desk for assistance as needed with feedings.   Report and POC to Kathaleen Maser RN.    Maternal Data Formula Feeding for Exclusion: No Has patient been taught Hand Expression?: Yes  Feeding Feeding Type: Breast Fed Length of feed: 20 min  LATCH Score/Interventions Latch: Repeated attempts needed to sustain latch, nipple held in mouth throughout feeding, stimulation needed to elicit sucking reflex. Intervention(s): Skin to skin;Teach feeding cues;Waking techniques Intervention(s): Breast massage;Breast compression;Assist with latch;Adjust position  Audible Swallowing: A few with stimulation Intervention(s): Hand expression Intervention(s): Alternate breast massage  Type of Nipple: Everted at rest and after stimulation  Comfort (Breast/Nipple): Soft / non-tender     Hold (Positioning): Assistance needed to correctly position infant at breast and maintain latch. Intervention(s): Breastfeeding basics reviewed;Support Pillows;Position options;Skin to skin  LATCH Score: 7  Lactation Tools Discussed/Used Tools: Pump Breast pump type: Manual WIC Program: Yes Pump Review: Setup, frequency, and cleaning;Milk Storage Initiated by:: Reviewed   Consult Status Consult Status: Follow-up Date: 09/17/15 Follow-up type: In-patient    Debby Freiberg Candice Lunney 09/16/2015, 11:59 AM

## 2015-09-16 NOTE — Progress Notes (Signed)
CSW made CPS report to [facility] for infant's positive UDS for THC. CPS will follow-up with [facility] after dc.  No barriers for dc for infant.    Laurey Arrow, MSW, LCSW Clinical Social Work [phone]

## 2015-09-17 ENCOUNTER — Ambulatory Visit: Payer: Self-pay

## 2015-09-17 NOTE — Lactation Note (Signed)
This note was copied from a baby's chart. Lactation Consultation Note  Patient Name: Mallory Hampton [id number] Date: 09/17/2015 Reason for consult: Follow-up assessment  Baby 63 hours old. Mom called for LC to assist with latching baby at breast. Baby latched deeply to right breast in football position. Mom reported no discomfort with latch. Baby suckled rhythmically with a few swallows noted. However, baby fussy after nursing and seemed to still be hungry. Mom has breast milk flowing with hand expression, but mom reports that she is getting 5-10 ml when she uses DEBP. Discussed WIC loaner if mom wanting to keep pumping. Mom states that she is unsure about whether she wants to keep nursing exclusively d/t the fact that she had a low supply with first child. Mom attempted to call [facility] again and states that she will decide about DEBP with WIC. Discussed benefits of hospital-grade DEBP. Assisted mom to feed baby 20 ml of formula and baby tolerated well. Baby seems much more satisfied. Mom given supplementation guidelines and "Infant Formula Preparation, Handling, and Storage" guidelines with review. Mom states that she will be getting formula on the way home.  Plan is for mom to put baby to breast first with cues, and then supplement with EBM/formula according to guidelines and to satiate baby. Enc mom to postpump and hand express, and mom aware of EBM storage guidelines. Enc mom to call for assistance as needed. Discussed assessment and interventions with patient's bedside nurse, Joellen Jersey, RN.     Maternal Data    Feeding Feeding Type: Breast Fed Length of feed: 20 min  LATCH Score/Interventions Latch: Grasps breast easily, tongue down, lips flanged, rhythmical sucking. Intervention(s): Skin to skin Intervention(s): Adjust position;Assist with latch;Breast massage;Breast compression  Audible Swallowing: A few with stimulation Intervention(s): Skin to skin;Hand expression  Type of Nipple:  Everted at rest and after stimulation  Comfort (Breast/Nipple): Soft / non-tender     Hold (Positioning): Assistance needed to correctly position infant at breast and maintain latch.  LATCH Score: 8  Lactation Tools Discussed/Used     Consult Status Consult Status: PRN    Andres Labrum 09/17/2015, 11:18 AM

## 2015-09-17 NOTE — Lactation Note (Addendum)
This note was copied from a baby's chart. Lactation Consultation Note  Patient Name: Mallory Hampton [id number] Date: 09/17/2015 Reason for consult: Follow-up assessment Baby 56 hours old. Mom resting and baby sleeping when [place] entered room. Mom reports that baby is nursing well for 15 to 20 minutes at a time and she has called WIC to discussed getting a pump. Offered to assist with a latch, but mom declined stating that baby is nursing well and sleeping right now. Mom reports that she has been doing some additional pumping to enc a good supply of milk. Mom asked when to introduce a bottle or formula. Discussed recommendations for exclusive BF, and discussed ways of knowing that baby getting enough at breast. Demonstrated how to use piston in mom's pumping kit to pump both breast manually. Enc hand expression as well. FOB had concerns about mom's THC use and baby getting breast milk. Discussed benefits of breast milk if mom no longer exposed to [facility]. Enc parents to call out for assistance at next latch. Referred mom to Baby and Me booklet for number of diapers to expect by day of life and EBM storage guidelines. Mom aware of OP/BFSG and [place] phone line assistance after D/C. Discussed weighing baby at [place].   Maternal Data    Feeding Feeding Type: Breast Fed Length of feed: 15 min  LATCH Score/Interventions                      Lactation Tools Discussed/Used     Consult Status Consult Status: Complete    Andres Labrum 09/17/2015, 9:21 AM

## 2015-09-18 ENCOUNTER — Other Ambulatory Visit: Payer: Self-pay | Admitting: Advanced Practice Midwife

## 2015-09-18 LAB — TYPE AND SCREEN
ABO/RH(D): A POS
ANTIBODY SCREEN: POSITIVE
DAT, IGG: NEGATIVE
DONOR AG TYPE: NEGATIVE
Donor AG Type: NEGATIVE
PT AG TYPE: NEGATIVE
Unit division: 0
Unit division: 0

## 2015-09-19 ENCOUNTER — Inpatient Hospital Stay (HOSPITAL_COMMUNITY): Admission: RE | Admit: 2015-09-19 | Payer: Medicaid Other | Source: Ambulatory Visit

## 2015-09-20 NOTE — Progress Notes (Signed)
CSW reviewed cord screen results and forwarded positive results to [facility] CPS.  Laurey Arrow, MSW, LCSW Clinical Social Work [phone]

## 2015-09-25 ENCOUNTER — Emergency Department (HOSPITAL_COMMUNITY): Payer: No Typology Code available for payment source

## 2015-09-25 ENCOUNTER — Emergency Department (HOSPITAL_COMMUNITY)
Admission: EM | Admit: 2015-09-25 | Discharge: 2015-09-25 | Disposition: A | Discharge status: Home / Self Care | Payer: No Typology Code available for payment source | Attending: Emergency Medicine | Admitting: Emergency Medicine

## 2015-09-25 ENCOUNTER — Encounter (HOSPITAL_COMMUNITY): Payer: Self-pay | Admitting: Emergency Medicine

## 2015-09-25 DIAGNOSIS — Y9389 Activity, other specified: Secondary | ICD-10-CM | POA: Diagnosis not present

## 2015-09-25 DIAGNOSIS — S199XXA Unspecified injury of neck, initial encounter: Secondary | ICD-10-CM | POA: Diagnosis present

## 2015-09-25 DIAGNOSIS — S161XXA Strain of muscle, fascia and tendon at neck level, initial encounter: Secondary | ICD-10-CM | POA: Diagnosis not present

## 2015-09-25 DIAGNOSIS — Z87891 Personal history of nicotine dependence: Secondary | ICD-10-CM | POA: Insufficient documentation

## 2015-09-25 DIAGNOSIS — Y999 Unspecified external cause status: Secondary | ICD-10-CM | POA: Insufficient documentation

## 2015-09-25 DIAGNOSIS — Y92410 Unspecified street and highway as the place of occurrence of the external cause: Secondary | ICD-10-CM | POA: Insufficient documentation
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
## 2015-09-25 NOTE — Discharge Instructions (Signed)
Take tylenol and ibuprofen as needed for pain. Return as needed for worsening symptoms.
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
## 2015-09-25 NOTE — ED Notes (Addendum)
Hope Neese, NP at bedside at this time.  

## 2015-09-27 ENCOUNTER — Encounter (HOSPITAL_COMMUNITY): Payer: Self-pay | Admitting: Emergency Medicine

## 2015-09-27 ENCOUNTER — Ambulatory Visit (HOSPITAL_COMMUNITY)
Admission: EM | Admit: 2015-09-27 | Discharge: 2015-09-27 | Disposition: A | Discharge status: Home / Self Care | Payer: Medicaid Other | Attending: Internal Medicine | Admitting: Internal Medicine

## 2015-09-27 DIAGNOSIS — S161XXA Strain of muscle, fascia and tendon at neck level, initial encounter: Secondary | ICD-10-CM

## 2015-09-27 DIAGNOSIS — S161XXD Strain of muscle, fascia and tendon at neck level, subsequent encounter: Secondary | ICD-10-CM

## 2015-09-27 MED ORDER — DIAZEPAM 2 MG PO TABS: 2.0000 mg | ORAL_TABLET | Freq: Four times a day (QID) | ORAL | 0 refills | Status: AC | PRN
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
## 2015-09-27 NOTE — Discharge Instructions (Addendum)
Anticipate gradual improvement in neck spasms/discomfort over the next week or so. Prescription for low-dose diazepam (muscle relaxer) given.  Ice 5-10 minutes 2-4 x daily may help speed improvement.  Physical therapy is sometimes helpful if symptoms are prolonged.

## 2015-10-17 ENCOUNTER — Ambulatory Visit: Payer: Medicaid Other | Admitting: Obstetrics and Gynecology

## 2015-12-30 NOTE — Telephone Encounter (Signed)
error 

## 2016-12-26 IMAGING — US US MFM OB COMP +14 WKS
1 series · 14 of 28 positions shown · non-contrast
Comparison: none

1  RUDI JUMPER          [id number]      [id number]     [id number]
Indications

35 weeks gestation of pregnancy
Basic anatomic survey                          Z36
Poor obstetric history: Previous fetal growth
restriction (FGR)
Thrombocytopenia affecting pregnancy,          O99.119,
antepartum
OB History
Gravidity:    2         Prem:   1
Living:       1
Fetal Evaluation
Num Of Fetuses:     1
Fetal Heart         158
Rate(bpm):
Cardiac Activity:   Observed
Presentation:       Cephalic
Placenta:           Posterior, above cervical os
P. Cord Insertion:  Visualized
Amniotic Fluid
AFI FV:      Subjectively within normal limits
AFI Sum(cm)     %Tile       Largest Pocket(cm)
16.44           60
RUQ(cm)       RLQ(cm)       LUQ(cm)        LLQ(cm)
4.11
Biometry
BPD:      87.6  mm     G. Age:  35w 3d         57  %    CI:        77.37   %    70 - 86
FL/HC:      20.7   %    20.1 -
HC:      315.3  mm     G. Age:  35w 3d         21  %    HC/AC:      1.01        0.93 -
AC:      312.9  mm     G. Age:  35w 1d         56  %    FL/BPD:     74.4   %    71 - 87
FL:       65.2  mm     G. Age:  33w 4d          9  %    FL/AC:      20.8   %    20 - 24
Est. FW:    0505  gm      5 lb 9 oz     53  %
Gestational Age
LMP:           34w 0d        Date:  12/11/14                 EDD:   09/17/15
U/S Today:     34w 6d                                        EDD:   09/11/15
Best:          35w 2d     Det. By:  Early Ultrasound         EDD:   09/08/15
(04/22/15)
Anatomy
Cranium:               Appears normal         LVOT:                   Appears normal
Cavum:                 Appears normal         Aortic Arch:            Not well visualized
Ventricles:            Appears normal         Ductal Arch:            Not well visualized
Choroid Plexus:        Appears normal         Diaphragm:              Appears normal
Cerebellum:            Appears normal         Stomach:                Appears normal, left
sided
Posterior Fossa:       Not well visualized    Abdomen:                Appears normal
Nuchal Fold:           Not well visualized    Abdominal Wall:         Not well visualized
Face:                  Appears normal         Cord Vessels:           Appears normal (3
(orbits and profile)                           vessel cord)
Lips:                  Appears normal         Kidneys:                Appear normal
Palate:                Appears normal         Bladder:                Appears normal
Thoracic:              Appears normal         Spine:                  Appears normal
Heart:                 Appears normal         Upper Extremities:      Appears normal
(4CH, axis, and situs
RVOT:                  Appears normal         Lower Extremities:      Appears normal
Other:  Fetus appears to be a male. Technically difficult due to maternal
habitus and fetal position.
Impression
INDICATION: 25 yr old [id number] at 10w5d with history of baby
with growth restriction and possible abruption and now with
likely gestational thrombocytopenia for fetal ultrasound.

[Series 1: us mfm ob comp +14 wks · 81 acquisitions, 14 frames shown]
[im 3/81]
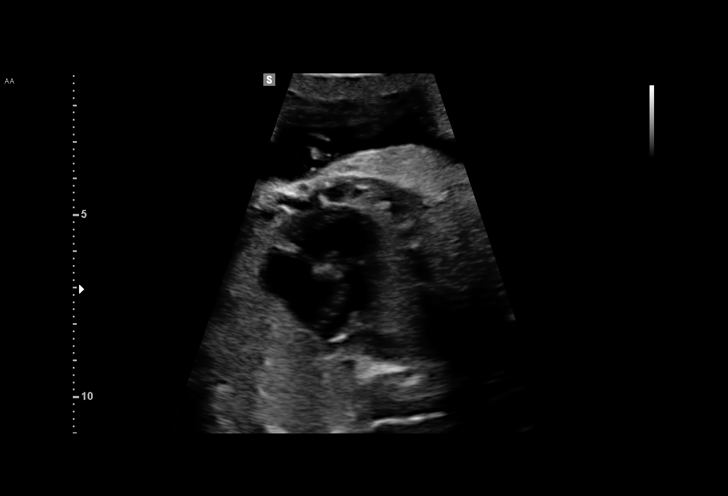
[im 9/81]
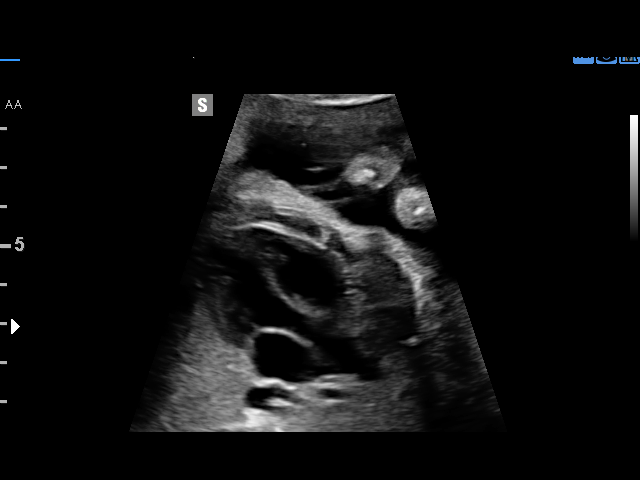
[im 15/81]
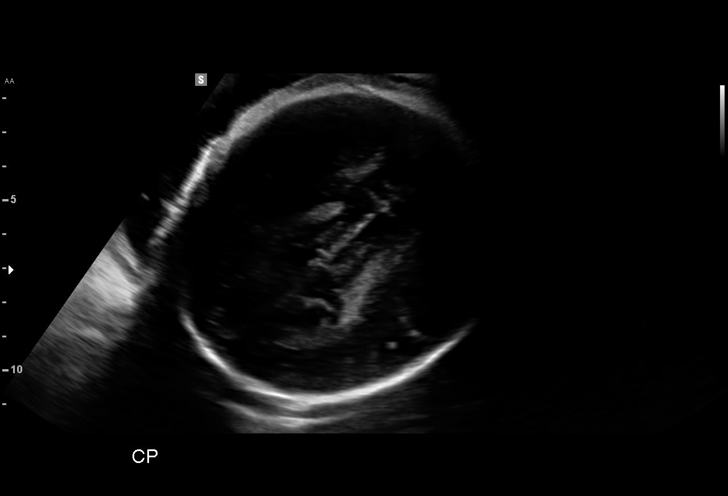
[im 21/81]
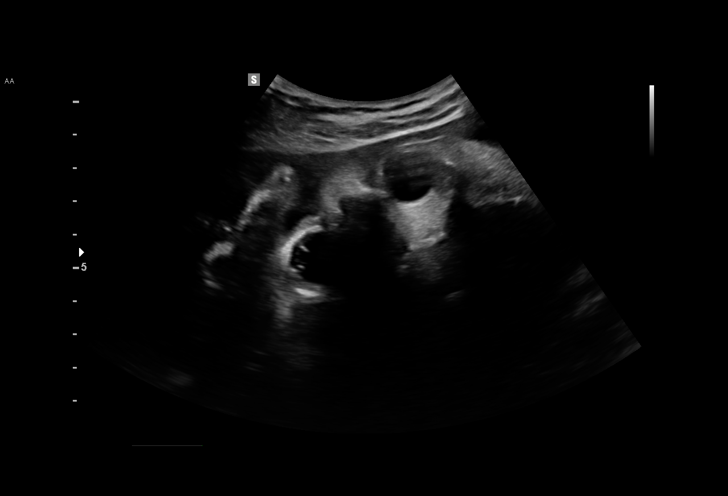
[im 27/81]
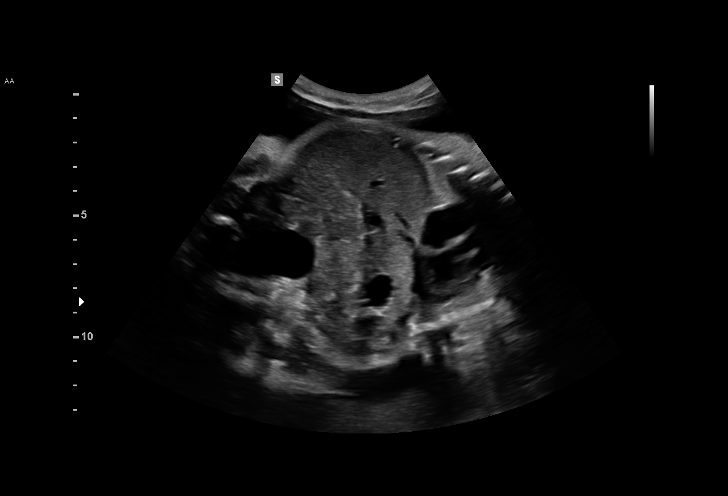
[im 33/81]
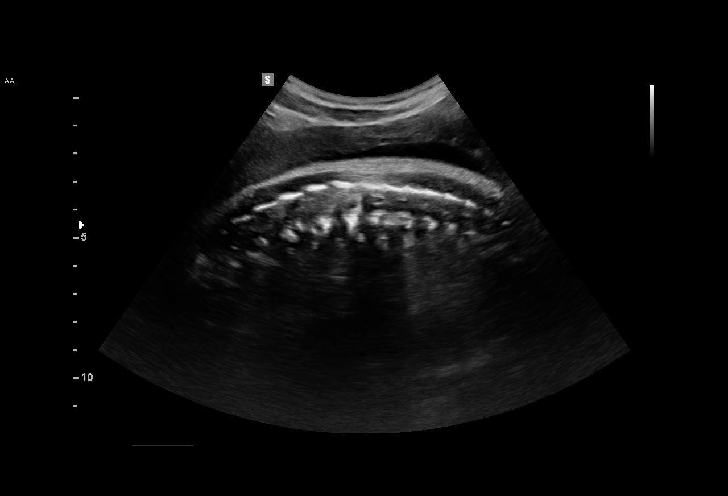
[im 39/81]
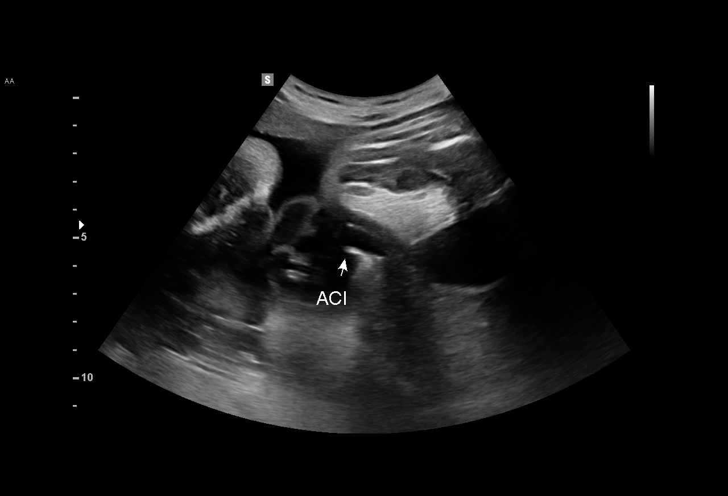
[im 45/81]
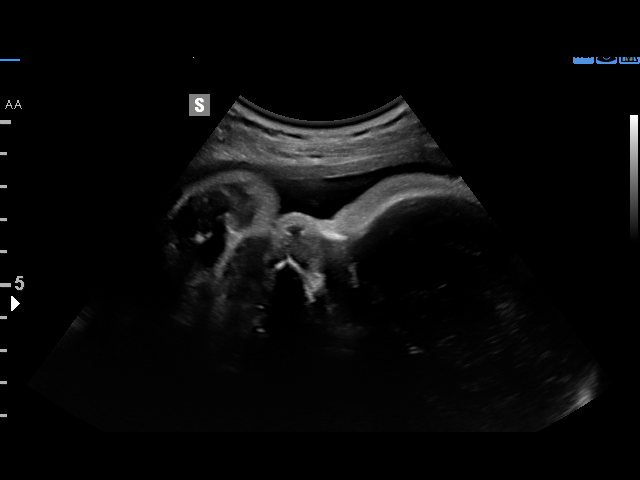
[im 51/81]
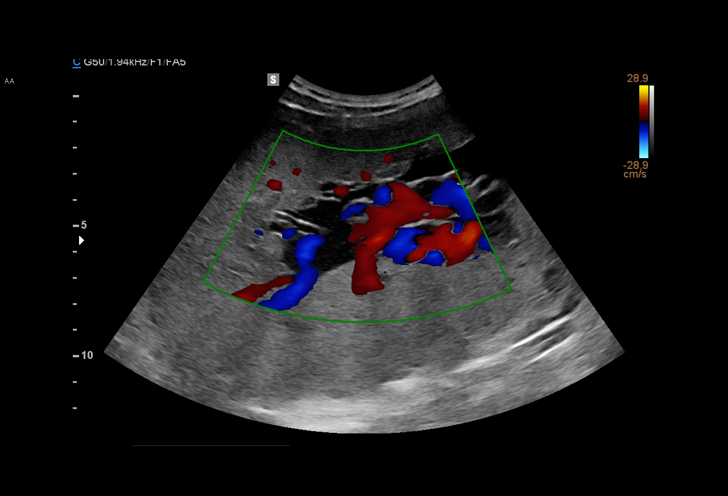
[im 57/81]
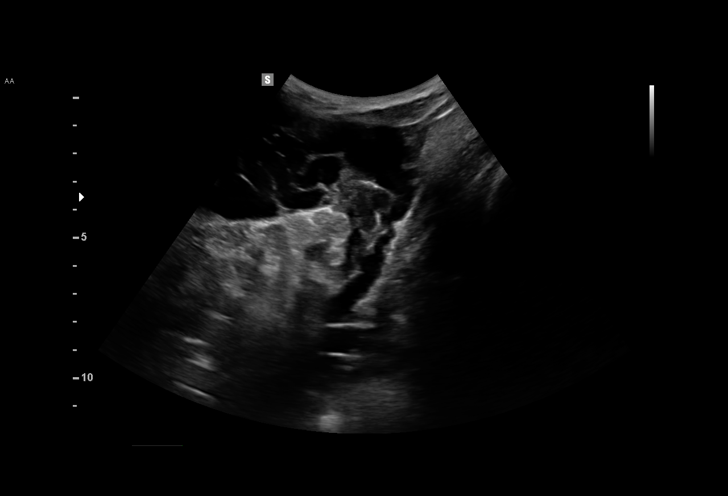
[im 63/81]
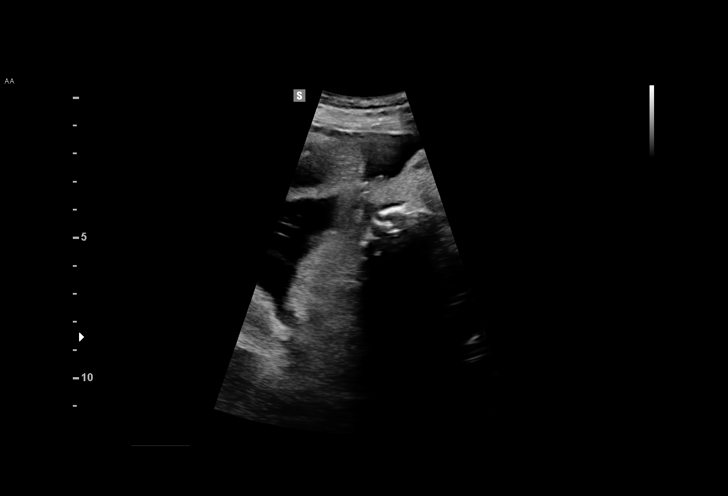
[im 69/81]
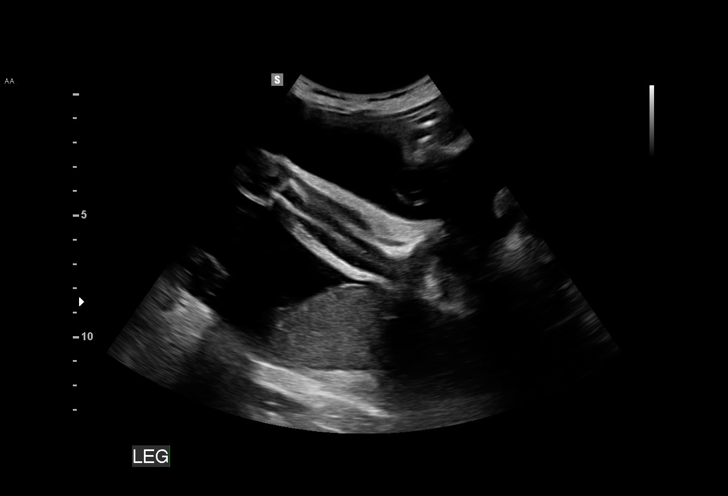
[im 75/81]
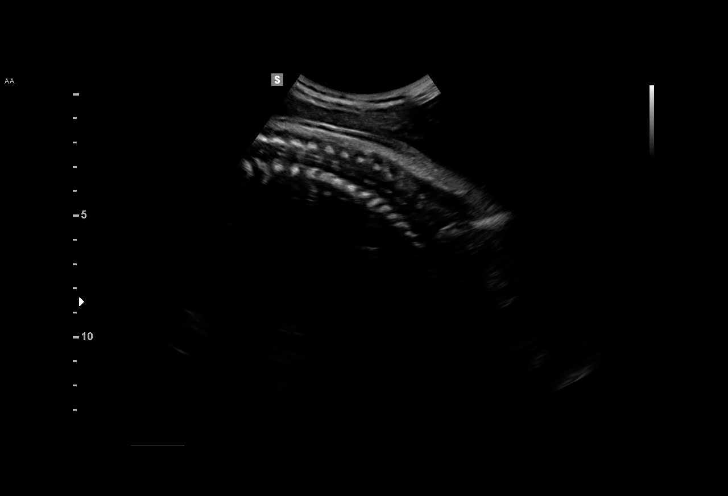
[im 81/81]
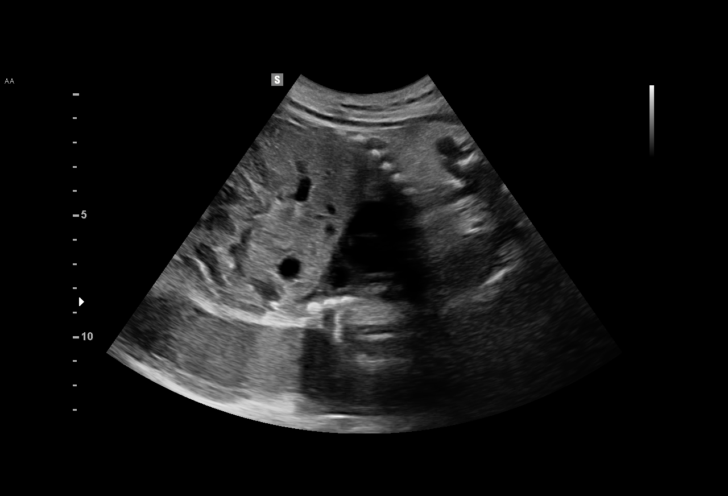

[14 of 28 positions shown; findings below may reference images not displayed]

FINDINGS: 1. Single intrauterine pregnancy.
2. Estimated fetal weight is in the 53rd%.
3. Posterior placenta without evidence of previa.
4. Normal amniotic fluid index.
5. The views of the ductal and aortic arches and abdominal
cord insertion are limited.
6. The remainder of the limited anatomy survey is normal.
Recommendations

1. Appropriate fetal growth.
2. Normal limited anatomy survey:
- patient understands the limitations of ultrasound in detecting
fetal anomalies
- limited by advanced gestational age
3. History of baby with growth restriction/ possible abruption:
- recommend fetal growth every 4 weeks; appropriate fetal
growth today
- recommend in subsequent pregnancies starting a low dose
aspirin by 19 weeks to reduce the risk of fetal growth
restriction
4. Likely gestational thrombocytopenia:
- see consult note
- recommend repeat platelet level in 3 weeks
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
## 2017-11-18 ENCOUNTER — Encounter: Payer: Self-pay | Admitting: *Deleted

## 2020-01-24 ENCOUNTER — Other Ambulatory Visit: Payer: Self-pay
